# Patient Record
Sex: Female | Born: 1950 | Race: Black or African American | Hispanic: No | Smoking: Never smoker
Health system: Southern US, Community
[De-identification: ages and names within clinical notes are randomized; demographics above are authoritative.]

## PROBLEM LIST (undated history)

## (undated) ENCOUNTER — Emergency Department: Disposition: A | Discharge status: Home / Self Care

## (undated) DIAGNOSIS — N183 Chronic kidney disease, stage 3 unspecified: Secondary | ICD-10-CM

## (undated) DIAGNOSIS — I10 Essential (primary) hypertension: Secondary | ICD-10-CM

## (undated) DIAGNOSIS — B1920 Unspecified viral hepatitis C without hepatic coma: Secondary | ICD-10-CM

## (undated) HISTORY — PX: DILATION AND CURETTAGE OF UTERUS: SHX78

## (undated) HISTORY — DX: Chronic kidney disease, stage 3 unspecified: N18.30

## (undated) HISTORY — PX: UMBILICAL HERNIA REPAIR: SHX196

## (undated) HISTORY — PX: INGUINAL HERNIA REPAIR: SHX194

## (undated) HISTORY — PX: BREAST LUMPECTOMY: SHX2

## (undated) HISTORY — PX: HERNIA REPAIR: SHX51

## (undated) HISTORY — PX: ABDOMINAL HYSTERECTOMY: SHX81

---

## 2008-10-20 ENCOUNTER — Encounter: Admission: RE | Admit: 2008-10-20 | Discharge: 2008-10-20 | Payer: Self-pay | Admitting: Cardiology

## 2009-06-22 ENCOUNTER — Ambulatory Visit (HOSPITAL_COMMUNITY): Admission: RE | Admit: 2009-06-22 | Discharge: 2009-06-22 | Payer: Self-pay | Admitting: Obstetrics and Gynecology

## 2009-06-22 ENCOUNTER — Encounter (INDEPENDENT_AMBULATORY_CARE_PROVIDER_SITE_OTHER): Payer: Self-pay | Admitting: Obstetrics and Gynecology

## 2009-11-27 ENCOUNTER — Emergency Department (HOSPITAL_COMMUNITY): Admission: EM | Admit: 2009-11-27 | Discharge: 2009-11-27 | Payer: Self-pay | Admitting: Family Medicine

## 2010-03-08 ENCOUNTER — Emergency Department (HOSPITAL_COMMUNITY): Admission: EM | Admit: 2010-03-08 | Discharge: 2010-03-08 | Payer: Self-pay | Admitting: Family Medicine

## 2010-07-22 ENCOUNTER — Emergency Department (HOSPITAL_COMMUNITY): Admission: EM | Admit: 2010-07-22 | Discharge: 2010-07-22 | Payer: Self-pay | Admitting: Family Medicine

## 2010-09-25 ENCOUNTER — Other Ambulatory Visit (HOSPITAL_COMMUNITY): Payer: Self-pay | Admitting: Cardiology

## 2010-09-25 DIAGNOSIS — I209 Angina pectoris, unspecified: Secondary | ICD-10-CM

## 2010-10-09 ENCOUNTER — Ambulatory Visit (HOSPITAL_COMMUNITY)
Admission: RE | Admit: 2010-10-09 | Discharge: 2010-10-09 | Disposition: A | Discharge status: Home / Self Care | Source: Ambulatory Visit | Attending: Cardiology | Admitting: Cardiology

## 2010-10-09 ENCOUNTER — Encounter (HOSPITAL_COMMUNITY): Payer: Self-pay

## 2010-10-09 ENCOUNTER — Ambulatory Visit (HOSPITAL_COMMUNITY): Payer: Self-pay

## 2010-10-09 DIAGNOSIS — R9431 Abnormal electrocardiogram [ECG] [EKG]: Secondary | ICD-10-CM | POA: Insufficient documentation

## 2010-10-09 DIAGNOSIS — R079 Chest pain, unspecified: Secondary | ICD-10-CM | POA: Insufficient documentation

## 2010-10-09 DIAGNOSIS — I209 Angina pectoris, unspecified: Secondary | ICD-10-CM

## 2010-10-09 MED ORDER — TECHNETIUM TC 99M TETROFOSMIN IV KIT
10.0000 | PACK | Freq: Once | INTRAVENOUS | Status: AC | PRN
  Administered 2010-10-09: 10 via INTRAVENOUS

## 2010-10-09 MED ORDER — TECHNETIUM TC 99M TETROFOSMIN IV KIT
30.0000 | PACK | Freq: Once | INTRAVENOUS | Status: AC | PRN
  Administered 2010-10-09: 30 via INTRAVENOUS

## 2010-11-12 LAB — POCT URINALYSIS DIPSTICK
Glucose, UA: NEGATIVE mg/dL
Hgb urine dipstick: NEGATIVE
Ketones, ur: NEGATIVE mg/dL
Specific Gravity, Urine: >=1.03 (ref 1.005–1.030)
Urobilinogen, UA: 0.2 mg/dL (ref 0.0–1.0)

## 2010-11-25 LAB — URINE CULTURE: Colony Count: 2000

## 2010-11-25 LAB — POCT URINALYSIS DIP (DEVICE)
Bilirubin Urine: NEGATIVE
Hgb urine dipstick: NEGATIVE
Nitrite: NEGATIVE
Specific Gravity, Urine: 1.015 (ref 1.005–1.030)
pH: 5 (ref 5.0–8.0)

## 2010-12-05 LAB — CBC
MCV: 84.5 fL (ref 78.0–100.0)
Platelets: 282 10*3/uL (ref 150–400)
RBC: 4.32 MIL/uL (ref 3.87–5.11)
WBC: 5.2 10*3/uL (ref 4.0–10.5)

## 2010-12-05 LAB — BASIC METABOLIC PANEL
CO2: 28 mEq/L (ref 19–32)
Chloride: 105 mEq/L (ref 96–112)
GFR calc Af Amer: >60 mL/min (ref >60)
Potassium: 3.3 mEq/L — ABNORMAL LOW (ref 3.5–5.1)
Sodium: 139 mEq/L (ref 135–145)

## 2010-12-21 ENCOUNTER — Inpatient Hospital Stay (INDEPENDENT_AMBULATORY_CARE_PROVIDER_SITE_OTHER)
Admission: RE | Admit: 2010-12-21 | Discharge: 2010-12-21 | Disposition: A | Discharge status: Home / Self Care | Source: Ambulatory Visit | Attending: Emergency Medicine | Admitting: Emergency Medicine

## 2010-12-21 DIAGNOSIS — J019 Acute sinusitis, unspecified: Secondary | ICD-10-CM

## 2010-12-21 DIAGNOSIS — N390 Urinary tract infection, site not specified: Secondary | ICD-10-CM

## 2010-12-21 LAB — POCT URINALYSIS DIP (DEVICE)
Bilirubin Urine: NEGATIVE
Glucose, UA: NEGATIVE mg/dL
Hgb urine dipstick: NEGATIVE
Ketones, ur: NEGATIVE mg/dL
Specific Gravity, Urine: 1.02 (ref 1.005–1.030)
pH: 5.5 (ref 5.0–8.0)

## 2010-12-23 LAB — URINE CULTURE
Colony Count: NO GROWTH
Culture  Setup Time: 201204220139

## 2011-03-21 ENCOUNTER — Inpatient Hospital Stay (INDEPENDENT_AMBULATORY_CARE_PROVIDER_SITE_OTHER)
Admission: RE | Admit: 2011-03-21 | Discharge: 2011-03-21 | Disposition: A | Discharge status: Home / Self Care | Source: Ambulatory Visit | Attending: Family Medicine | Admitting: Family Medicine

## 2011-03-21 ENCOUNTER — Ambulatory Visit (INDEPENDENT_AMBULATORY_CARE_PROVIDER_SITE_OTHER)

## 2011-03-21 DIAGNOSIS — S92919A Unspecified fracture of unspecified toe(s), initial encounter for closed fracture: Secondary | ICD-10-CM

## 2011-05-26 ENCOUNTER — Ambulatory Visit (HOSPITAL_BASED_OUTPATIENT_CLINIC_OR_DEPARTMENT_OTHER)
Admission: RE | Admit: 2011-05-26 | Discharge: 2011-05-26 | Disposition: A | Discharge status: Home / Self Care | Source: Ambulatory Visit | Attending: Obstetrics and Gynecology | Admitting: Obstetrics and Gynecology

## 2011-05-26 ENCOUNTER — Other Ambulatory Visit: Payer: Self-pay | Admitting: Obstetrics and Gynecology

## 2011-05-26 DIAGNOSIS — Z01810 Encounter for preprocedural cardiovascular examination: Secondary | ICD-10-CM | POA: Insufficient documentation

## 2011-05-26 DIAGNOSIS — Z01812 Encounter for preprocedural laboratory examination: Secondary | ICD-10-CM | POA: Insufficient documentation

## 2011-05-26 DIAGNOSIS — N859 Noninflammatory disorder of uterus, unspecified: Secondary | ICD-10-CM | POA: Insufficient documentation

## 2011-05-26 DIAGNOSIS — R9389 Abnormal findings on diagnostic imaging of other specified body structures: Secondary | ICD-10-CM | POA: Insufficient documentation

## 2011-05-26 LAB — POCT I-STAT 4, (NA,K, GLUC, HGB,HCT)
HCT: 40 % (ref 36.0–46.0)
Potassium: 4.2 mEq/L (ref 3.5–5.1)
Sodium: 142 mEq/L (ref 135–145)

## 2011-10-18 ENCOUNTER — Emergency Department (INDEPENDENT_AMBULATORY_CARE_PROVIDER_SITE_OTHER)
Admission: EM | Admit: 2011-10-18 | Discharge: 2011-10-18 | Disposition: A | Discharge status: Home / Self Care | Source: Home / Self Care | Attending: Emergency Medicine | Admitting: Emergency Medicine

## 2011-10-18 ENCOUNTER — Encounter (HOSPITAL_COMMUNITY): Payer: Self-pay | Admitting: *Deleted

## 2011-10-18 DIAGNOSIS — N390 Urinary tract infection, site not specified: Secondary | ICD-10-CM

## 2011-10-18 HISTORY — DX: Essential (primary) hypertension: I10

## 2011-10-18 LAB — POCT URINALYSIS DIP (DEVICE)
Bilirubin Urine: NEGATIVE
Ketones, ur: NEGATIVE mg/dL
Leukocytes, UA: NEGATIVE
Protein, ur: NEGATIVE mg/dL
Specific Gravity, Urine: 1.02 (ref 1.005–1.030)
pH: 5.5 (ref 5.0–8.0)

## 2011-10-18 MED ORDER — CEPHALEXIN 500 MG PO CAPS: 500.0000 mg | ORAL_CAPSULE | Freq: Three times a day (TID) | ORAL | Status: AC

## 2011-10-18 MED ORDER — PHENAZOPYRIDINE HCL 200 MG PO TABS: 200.0000 mg | ORAL_TABLET | Freq: Three times a day (TID) | ORAL | Status: AC | PRN

## 2011-10-18 NOTE — ED Provider Notes (Signed)
Chief Complaint  Patient presents with  . Dysuria  . Back Pain    History of Present Illness:   Mrs. Helminiak has had a five-day history of dysuria, hesitancy, bilateral lower back pain, and lower abdominal pain. She also has some headache, and loose stools. Her last menstrual period was years ago. She denies any GYN complaints. She denies any frequency or urgency. She's had no blood in her urine, fever, chills, nausea, or vomiting. She has had urinary tract infections in the past, her last one was about a year ago. She is allergic to Bactrim.  Review of Systems:  Other than noted above, the patient denies any of the following symptoms: General:  No fevers, chills, sweats, aches, or fatigue. GI:  No abdominal pain, back pain, nausea, vomiting, diarrhea, or constipation. GU:  No dysuria, frequency, urgency, hematuria, or incontinence. GYN:  No discharge, itching, vulvar pain or lesions, pelvic pain, or abnormal vaginal bleeding.  PMFSH:  Past medical history, family history, social history, meds, and allergies were reviewed.  Physical Exam:   Vital signs:  BP 144/78  Pulse 92  Temp(Src) 98.9 F (37.2 C) (Oral)  Resp 18  SpO2 100% Gen:  Alert, oriented, in no distress. Lungs:  Clear to auscultation, no wheezes, rales or rhonchi. Heart:  Regular rhythm, no gallop or murmer. Abdomen:  Flat and soft. There was slight suprapubic pain to palpation.  No guarding, or rebound.  No hepato-splenomegaly or mass.  Bowel sounds were normally active.  No hernia. Back:  No CVA tenderness.  Skin:  Clear, warm and dry.  Labs:   Results for orders placed during the hospital encounter of 10/18/11  POCT URINALYSIS DIP (DEVICE)      Component Value Range   Glucose, UA NEGATIVE  NEGATIVE (mg/dL)   Bilirubin Urine NEGATIVE  NEGATIVE    Ketones, ur NEGATIVE  NEGATIVE (mg/dL)   Specific Gravity, Urine 1.020  1.005 - 1.030    Hgb urine dipstick NEGATIVE  NEGATIVE    pH 5.5  5.0 - 8.0    Protein, ur NEGATIVE   NEGATIVE (mg/dL)   Urobilinogen, UA 0.2  0.0 - 1.0 (mg/dL)   Nitrite NEGATIVE  NEGATIVE    Leukocytes, UA NEGATIVE  NEGATIVE     A urine culture was obtained.  Assessment:  Diagnoses that have been ruled out:  None  Diagnoses that are still under consideration:  None  Final diagnoses:  UTI (lower urinary tract infection)     Plan:   1.  The following meds were prescribed:   New Prescriptions   CEPHALEXIN (KEFLEX) 500 MG CAPSULE    Take 1 capsule (500 mg total) by mouth 3 (three) times daily.   PHENAZOPYRIDINE (PYRIDIUM) 200 MG TABLET    Take 1 tablet (200 mg total) by mouth 3 (three) times daily as needed for pain.   2.  The patient was instructed in symptomatic care and handouts were given. 3.  The patient was told to return if becoming worse in any way, if no better in 3 or 4 days, and given some red flag symptoms that would indicate earlier return. 4.  The patient was told to avoid intercourse for 10 days, get extra fluids, and return for a follow up with her primary care doctor at the completion of treatment for a repeat UA and culture.     Roque Lias, MD 10/18/11 1131

## 2011-10-18 NOTE — ED Notes (Signed)
Pt with c/o burning with urination and low back pain bilaterally x 5 days - history UTI - denies vaginal symptoms

## 2011-10-18 NOTE — Discharge Instructions (Signed)

## 2011-10-20 LAB — URINE CULTURE
Colony Count: >=100000
Culture  Setup Time: 201302162002

## 2011-10-23 NOTE — ED Notes (Signed)
Urine culture: >100,000 colonies Enterococcus species. Pt. treated with Keflex- not on sensitivity. Dr. Lorenz Coaster wrote that it is sensitive to Ampicillin, so tx. should be adequate. Jennifer Trevino 10/23/2011

## 2012-01-17 ENCOUNTER — Other Ambulatory Visit: Payer: Self-pay | Admitting: Cardiology

## 2012-03-25 ENCOUNTER — Other Ambulatory Visit: Payer: Self-pay | Admitting: Cardiology

## 2012-04-25 ENCOUNTER — Other Ambulatory Visit: Payer: Self-pay | Admitting: Cardiology

## 2012-10-31 ENCOUNTER — Encounter (HOSPITAL_COMMUNITY): Payer: Self-pay | Admitting: Emergency Medicine

## 2012-10-31 ENCOUNTER — Emergency Department (INDEPENDENT_AMBULATORY_CARE_PROVIDER_SITE_OTHER)
Admission: EM | Admit: 2012-10-31 | Discharge: 2012-10-31 | Disposition: A | Discharge status: Home / Self Care | Source: Home / Self Care | Attending: Family Medicine | Admitting: Family Medicine

## 2012-10-31 DIAGNOSIS — N390 Urinary tract infection, site not specified: Secondary | ICD-10-CM

## 2012-10-31 LAB — POCT URINALYSIS DIP (DEVICE)
Leukocytes, UA: NEGATIVE
Nitrite: NEGATIVE
Protein, ur: NEGATIVE mg/dL
Urobilinogen, UA: 0.2 mg/dL (ref 0.0–1.0)
pH: 5.5 (ref 5.0–8.0)

## 2012-10-31 MED ORDER — CEPHALEXIN 500 MG PO CAPS: 500.0000 mg | ORAL_CAPSULE | Freq: Four times a day (QID) | ORAL | Status: DC

## 2012-10-31 NOTE — ED Provider Notes (Signed)
History     CSN: [id number]  Arrival date & time 10/31/12  1433   First MD Initiated Contact with Patient 10/31/12 1433      Chief Complaint  Patient presents with  . Urinary Tract Infection    lower back. urgency frequency. nausea    (Consider location/radiation/quality/duration/timing/severity/associated sxs/prior treatment) Patient is a 62 y.o. female presenting with frequency. The history is provided by the patient.  Urinary Frequency This is a new problem. The current episode started more than 2 days ago. The problem has been gradually worsening. Associated symptoms include abdominal pain. Associated symptoms comments: Low back discomfort..    Past Medical History  Diagnosis Date  . Hypertension     Past Surgical History  Procedure Laterality Date  . Breast lumpectomy    . Hernia repair    . Umbilical hernia repair    . Inguinal hernia repair    . Dilation and curettage of uterus      History reviewed. No pertinent family history.  History  Substance Use Topics  . Smoking status: Never Smoker   . Smokeless tobacco: Not on file  . Alcohol Use: No    OB History   Grav Para Term Preterm Abortions TAB SAB Ect Mult Living                  Review of Systems  Constitutional: Negative.  Negative for fever.  Gastrointestinal: Positive for abdominal pain. Negative for nausea and vomiting.  Genitourinary: Positive for dysuria, urgency, frequency and pelvic pain. Negative for vaginal bleeding and vaginal discharge.    Allergies  Bactrim  Home Medications   Current Outpatient Rx  Name  Route  Sig  Dispense  Refill  . amLODipine-valsartan (EXFORGE) 5-320 MG per tablet   Oral   Take 1 tablet by mouth daily.         . Omega-3 Fatty Acids (OMEGA 3 PO)   Oral   Take 2 tablets by mouth 1 day or 1 dose.         . traMADol (ULTRAM) 50 MG tablet   Oral   Take 50 mg by mouth 1 day or 1 dose.         . zolpidem (AMBIEN) 10 MG tablet   Oral   Take 10 mg  by mouth at bedtime as needed.         . cephALEXin (KEFLEX) 500 MG capsule   Oral   Take 1 capsule (500 mg total) by mouth 4 (four) times daily. Take all of medicine and drink lots of fluids   20 capsule   0     BP 122/54  Pulse 113  Temp(Src) 99.6 F (37.6 C) (Oral)  Resp 18  SpO2 96%  Physical Exam  Nursing note and vitals reviewed. Constitutional: She is oriented to person, place, and time. She appears well-developed and well-nourished.  Abdominal: Soft. Bowel sounds are normal. She exhibits no distension and no mass. There is no tenderness. There is no rebound and no guarding.  Neurological: She is alert and oriented to person, place, and time.  Skin: Skin is warm and dry.    ED Course  Procedures (including critical care time)  Labs Reviewed  URINE CULTURE  POCT URINALYSIS DIP (DEVICE)   No results found.   1. UTI (lower urinary tract infection)       MDM          Linna Hoff, MD 10/31/12 1606

## 2012-10-31 NOTE — ED Notes (Signed)
Pt c/o lower back pain. Dysuria. Nausea. Frequency and urgency. Hx of recurrent uti's . Pt has increased fluids and is drinking cranberry juice with mild relief.

## 2012-11-02 LAB — URINE CULTURE

## 2016-10-21 ENCOUNTER — Ambulatory Visit (HOSPITAL_COMMUNITY)
Admission: EM | Admit: 2016-10-21 | Discharge: 2016-10-21 | Disposition: A | Discharge status: Home / Self Care | Payer: Medicare Other | Attending: Family Medicine | Admitting: Family Medicine

## 2016-10-21 ENCOUNTER — Encounter (HOSPITAL_COMMUNITY): Payer: Self-pay | Admitting: Emergency Medicine

## 2016-10-21 DIAGNOSIS — N390 Urinary tract infection, site not specified: Secondary | ICD-10-CM | POA: Insufficient documentation

## 2016-10-21 DIAGNOSIS — R1030 Lower abdominal pain, unspecified: Secondary | ICD-10-CM | POA: Diagnosis present

## 2016-10-21 LAB — POCT URINALYSIS DIP (DEVICE)
Bilirubin Urine: NEGATIVE
GLUCOSE, UA: NEGATIVE mg/dL
Hgb urine dipstick: NEGATIVE
KETONES UR: NEGATIVE mg/dL
Nitrite: POSITIVE — AB
PROTEIN: NEGATIVE mg/dL
SPECIFIC GRAVITY, URINE: 1.01 (ref 1.005–1.030)
Urobilinogen, UA: 0.2 mg/dL (ref 0.0–1.0)
pH: 5.5 (ref 5.0–8.0)

## 2016-10-21 MED ORDER — PHENAZOPYRIDINE HCL 200 MG PO TABS: 200.0000 mg | ORAL_TABLET | Freq: Three times a day (TID) | ORAL | 0 refills | Status: DC | PRN

## 2016-10-21 MED ORDER — CEPHALEXIN 500 MG PO CAPS
500.0000 mg | ORAL_CAPSULE | Freq: Four times a day (QID) | ORAL | 0 refills | Status: DC
Start: 2016-10-21 — End: 2016-11-14

## 2016-10-21 NOTE — ED Provider Notes (Signed)
CSN: [id number]     Arrival date & time 10/21/16  1000 History   First MD Initiated Contact with Patient 10/21/16 1110     Chief Complaint  Patient presents with  . Abdominal Pain   (Consider location/radiation/quality/duration/timing/severity/associated sxs/prior Treatment) 66 year old female patient presents to clinic with 3-4 day history of dysuria, urgency, frequency, as well as lower abdominal pain. States she noticed this morning her urine appeared dark, along with foul odor. She denies fever, chills, nausea, or vomiting. She also denies flank pain, and back pain. She was treated approximately 1 month ago for UTI by her physician in [place], she states her current symptoms are consistent with her previous infection.   The history is provided by the patient.  Abdominal Pain    Past Medical History:  Diagnosis Date  . Hypertension    Past Surgical History:  Procedure Laterality Date  . ABDOMINAL HYSTERECTOMY    . BREAST LUMPECTOMY    . DILATION AND CURETTAGE OF UTERUS    . HERNIA REPAIR    . INGUINAL HERNIA REPAIR    . UMBILICAL HERNIA REPAIR     History reviewed. No pertinent family history. Social History  Substance Use Topics  . Smoking status: Never Smoker  . Smokeless tobacco: Never Used  . Alcohol use No   OB History    No data available     Review of Systems  Reason unable to perform ROS: as covered in HPI.  Gastrointestinal: Positive for abdominal pain.  All other systems reviewed and are negative.   Allergies  Bactrim  Home Medications   Prior to Admission medications   Medication Sig Start Date End Date Taking? Authorizing Provider  amLODipine-valsartan (EXFORGE) 5-320 MG per tablet Take 1 tablet by mouth daily.   Yes Historical Provider, MD  cephALEXin (KEFLEX) 500 MG capsule Take 1 capsule (500 mg total) by mouth 4 (four) times daily. 10/21/16   Barnet Glasgow, NP  phenazopyridine (PYRIDIUM) 200 MG tablet Take 1 tablet (200 mg total) by mouth 3  (three) times daily as needed for pain. 10/21/16   Barnet Glasgow, NP  zolpidem (AMBIEN) 10 MG tablet Take 10 mg by mouth at bedtime as needed.    Historical Provider, MD   Meds Ordered and Administered this Visit  Medications - No data to display  BP 133/81 (BP Location: Right Arm)   Pulse 91   Temp 98.8 F (37.1 C) (Oral)   Resp 18   SpO2 100%  No data found.   Physical Exam  Constitutional: She is oriented to person, place, and time. She appears well-developed and well-nourished. No distress.  Cardiovascular: Normal rate and regular rhythm.   Pulmonary/Chest: Effort normal and breath sounds normal.  Abdominal: Soft. Bowel sounds are normal. She exhibits no distension and no mass. There is no tenderness. There is no guarding and no CVA tenderness.  Neurological: She is alert and oriented to person, place, and time.  Skin: Skin is warm and dry. Capillary refill takes less than 2 seconds. She is not diaphoretic.  Psychiatric: She has a normal mood and affect.  Nursing note and vitals reviewed.   Urgent Care Course     Procedures (including critical care time)  Labs Review Labs Reviewed  POCT URINALYSIS DIP (DEVICE) - Abnormal; Notable for the following:       Result Value   Nitrite POSITIVE (*)    Leukocytes, UA TRACE (*)    All other components within normal limits  URINE CULTURE  Imaging Review No results found.   Visual Acuity Review  Right Eye Distance:   Left Eye Distance:   Bilateral Distance:    Right Eye Near:   Left Eye Near:    Bilateral Near:         MDM   1. Lower urinary tract infectious disease   You are being treated today for a urinary tract infection. I have prescribed Keflex, take 1 tablet 4 times a day for 5 days. I have also prescribed Pyridium. Take 1 tablet a day 3 times a day for 2 days. Your urine will be sent for culture and you will be notified should any change in therapy be needed. Drink plenty of fluids and rest. Should  your symptoms fail to resolve, follow up with your primary care provider or return to clinic.      Barnet Glasgow, NP 10/21/16 1120

## 2016-10-21 NOTE — Discharge Instructions (Signed)
You are being treated today for a urinary tract infection. I have prescribed Keflex, take 1 tablet 4 times a day for 5 days. I have also prescribed Pyridium. Take 1 tablet a day 3 times a day for 2 days. Your urine will be sent for culture and you will be notified should any change in therapy be needed. Drink plenty of fluids and rest. Should your symptoms fail to resolve, follow up with your primary care provider or return to clinic.  °

## 2016-10-21 NOTE — ED Notes (Signed)
Verified phone number 

## 2016-10-21 NOTE — ED Triage Notes (Signed)
The patient presented to the [facility] with a complaint of lower abdominal pain and urinary hesitancy. The patient stated that the urine was cloudy and had a strong odor as well. The patient reported the symptoms for 3 days.

## 2016-10-23 LAB — URINE CULTURE: Culture: >=100000 — AB

## 2016-11-14 ENCOUNTER — Encounter (HOSPITAL_COMMUNITY): Payer: Self-pay | Admitting: Emergency Medicine

## 2016-11-14 ENCOUNTER — Ambulatory Visit (HOSPITAL_COMMUNITY)
Admission: EM | Admit: 2016-11-14 | Discharge: 2016-11-14 | Disposition: A | Discharge status: Home / Self Care | Payer: Medicare Other | Attending: Internal Medicine | Admitting: Internal Medicine

## 2016-11-14 DIAGNOSIS — Z87440 Personal history of urinary (tract) infections: Secondary | ICD-10-CM | POA: Diagnosis not present

## 2016-11-14 DIAGNOSIS — N390 Urinary tract infection, site not specified: Secondary | ICD-10-CM

## 2016-11-14 DIAGNOSIS — R350 Frequency of micturition: Secondary | ICD-10-CM | POA: Diagnosis present

## 2016-11-14 DIAGNOSIS — I10 Essential (primary) hypertension: Secondary | ICD-10-CM | POA: Diagnosis not present

## 2016-11-14 DIAGNOSIS — Z882 Allergy status to sulfonamides status: Secondary | ICD-10-CM | POA: Insufficient documentation

## 2016-11-14 DIAGNOSIS — Z90710 Acquired absence of both cervix and uterus: Secondary | ICD-10-CM | POA: Diagnosis not present

## 2016-11-14 LAB — POCT URINALYSIS DIP (DEVICE)
Bilirubin Urine: NEGATIVE
GLUCOSE, UA: NEGATIVE mg/dL
KETONES UR: NEGATIVE mg/dL
NITRITE: POSITIVE — AB
Protein, ur: NEGATIVE mg/dL
Specific Gravity, Urine: 1.015 (ref 1.005–1.030)
Urobilinogen, UA: 0.2 mg/dL (ref 0.0–1.0)
pH: 6 (ref 5.0–8.0)

## 2016-11-14 MED ORDER — NITROFURANTOIN MONOHYD MACRO 100 MG PO CAPS: 100.0000 mg | ORAL_CAPSULE | Freq: Two times a day (BID) | ORAL | 0 refills | Status: DC

## 2016-11-14 MED ORDER — PHENAZOPYRIDINE HCL 200 MG PO TABS: 200.0000 mg | ORAL_TABLET | Freq: Three times a day (TID) | ORAL | 0 refills | Status: DC

## 2016-11-14 NOTE — ED Provider Notes (Signed)
[place]    CSN: [id number] Arrival date & time: 11/14/16  1027     History   Chief Complaint Chief Complaint  Patient presents with  . Urinary Frequency  . Abdominal Pain    HPI Jennifer Trevino is a 66 y.o. female. She presents today with 3-4 day history of urinary discomfort, frequency, mild suprapubic discomfort. Appetite is okay. Not vomiting, not febrile. She had a hysterectomy in [place] a few months ago, and has had several UTIs since.    HPI  Past Medical History:  Diagnosis Date  . Hypertension     Past Surgical History:  Procedure Laterality Date  . ABDOMINAL HYSTERECTOMY    . BREAST LUMPECTOMY    . DILATION AND CURETTAGE OF UTERUS    . HERNIA REPAIR    . INGUINAL HERNIA REPAIR    . UMBILICAL HERNIA REPAIR       Home Medications    Prior to Admission medications   Medication Sig Start Date End Date Taking? Authorizing Provider  amLODipine-valsartan (EXFORGE) 5-320 MG per tablet Take 1 tablet by mouth daily.   Yes Historical Provider, MD  zolpidem (AMBIEN) 10 MG tablet Take 10 mg by mouth at bedtime as needed.   Yes Historical Provider, MD  nitrofurantoin, macrocrystal-monohydrate, (MACROBID) 100 MG capsule Take 1 capsule (100 mg total) by mouth 2 (two) times daily. 11/14/16   Sherlene Shams, MD  phenazopyridine (PYRIDIUM) 200 MG tablet Take 1 tablet (200 mg total) by mouth 3 (three) times daily. 11/14/16   Sherlene Shams, MD    Family History History reviewed. No pertinent family history.  Social History Social History  Substance Use Topics  . Smoking status: Never Smoker  . Smokeless tobacco: Never Used  . Alcohol use No     Allergies   Bactrim   Review of Systems Review of Systems  All other systems reviewed and are negative.    Physical Exam Triage Vital Signs ED Triage Vitals  Enc Vitals Group     BP 11/14/16 1116 130/79     Pulse Rate 11/14/16 1116 98     Resp 11/14/16 1116 18     Temp 11/14/16 1116 98.7 F (37.1  C)     Temp Source 11/14/16 1116 Oral     SpO2 11/14/16 1116 99 %     Weight --      Height --      Pain Score 11/14/16 1115 6     Pain Loc --    Updated Vital Signs BP 130/79 (BP Location: Right Arm)   Pulse 98   Temp 98.7 F (37.1 C) (Oral)   Resp 18   SpO2 99%   Physical Exam  Constitutional: She is oriented to person, place, and time. No distress.  Alert, nicely groomed  HENT:  Head: Atraumatic.  Eyes:  Conjugate gaze, no eye redness/drainage  Neck: Neck supple.  Cardiovascular: Normal rate.   Pulmonary/Chest: No respiratory distress.  Abdominal: She exhibits no distension.  Musculoskeletal: Normal range of motion.  No leg swelling  Neurological: She is alert and oriented to person, place, and time.  Skin: Skin is warm and dry.  No cyanosis  Nursing note and vitals reviewed.    UC Treatments / Results  Labs Results for orders placed or performed during the hospital encounter of 11/14/16  POCT urinalysis dip (device)  Result Value Ref Range   Glucose, UA NEGATIVE NEGATIVE mg/dL   Bilirubin Urine NEGATIVE NEGATIVE   Ketones, ur  NEGATIVE NEGATIVE mg/dL   Specific Gravity, Urine 1.015 1.005 - 1.030   Hgb urine dipstick TRACE (A) NEGATIVE   pH 6.0 5.0 - 8.0   Protein, ur NEGATIVE NEGATIVE mg/dL   Urobilinogen, UA 0.2 0.0 - 1.0 mg/dL   Nitrite POSITIVE (A) NEGATIVE   Leukocytes, UA SMALL (A) NEGATIVE    Procedures Procedures (including critical care time) None today  Final Clinical Impressions(s) / UC Diagnoses   Final diagnoses:  Acute UTI   Push fluids, rest.  Recheck for new fever >100.5, persistent urinary symptoms, or if not starting to improve in a few days.  Urine test was consistent with UTI today; a urine culture is pending.  Prescriptions for phenazopyridine (for urinary discomfort) and nitrofurantoin (antibiotic) were sent to the [facility] on [address].  Followup with urologist to discuss further evaluation/management for frequent UTIs (contact  information in this handout).    New Prescriptions New Prescriptions   NITROFURANTOIN, MACROCRYSTAL-MONOHYDRATE, (MACROBID) 100 MG CAPSULE    Take 1 capsule (100 mg total) by mouth 2 (two) times daily.   PHENAZOPYRIDINE (PYRIDIUM) 200 MG TABLET    Take 1 tablet (200 mg total) by mouth 3 (three) times daily.     Sherlene Shams, MD 11/14/16 2209

## 2016-11-14 NOTE — ED Triage Notes (Signed)
The patient presented to the [facility] with a complaint of recurrent urinary frequency/urgency, dysuria and lower abdominal pain x 3 days.

## 2016-11-14 NOTE — Discharge Instructions (Addendum)
Push fluids, rest.  Recheck for new fever >100.5, persistent urinary symptoms, or if not starting to improve in a few days.  Urine test was consistent with UTI today; a urine culture is pending.  Prescriptions for phenazopyridine (for urinary discomfort) and nitrofurantoin (antibiotic) were sent to the [facility] on [address].  Followup with urologist to discuss further evaluation/management for frequent UTIs (contact information in this handout).

## 2016-11-16 LAB — URINE CULTURE

## 2017-12-17 ENCOUNTER — Other Ambulatory Visit: Payer: Self-pay

## 2017-12-17 ENCOUNTER — Encounter (HOSPITAL_COMMUNITY): Payer: Self-pay | Admitting: Emergency Medicine

## 2017-12-17 ENCOUNTER — Ambulatory Visit (HOSPITAL_COMMUNITY)
Admission: EM | Admit: 2017-12-17 | Discharge: 2017-12-17 | Disposition: A | Discharge status: Home / Self Care | Payer: Medicare Other | Attending: Family Medicine | Admitting: Family Medicine

## 2017-12-17 DIAGNOSIS — Z791 Long term (current) use of non-steroidal anti-inflammatories (NSAID): Secondary | ICD-10-CM | POA: Insufficient documentation

## 2017-12-17 DIAGNOSIS — Z9889 Other specified postprocedural states: Secondary | ICD-10-CM | POA: Diagnosis not present

## 2017-12-17 DIAGNOSIS — Z90710 Acquired absence of both cervix and uterus: Secondary | ICD-10-CM | POA: Insufficient documentation

## 2017-12-17 DIAGNOSIS — Z113 Encounter for screening for infections with a predominantly sexual mode of transmission: Secondary | ICD-10-CM | POA: Diagnosis not present

## 2017-12-17 DIAGNOSIS — R3915 Urgency of urination: Secondary | ICD-10-CM | POA: Diagnosis not present

## 2017-12-17 DIAGNOSIS — Z79899 Other long term (current) drug therapy: Secondary | ICD-10-CM | POA: Diagnosis not present

## 2017-12-17 DIAGNOSIS — R102 Pelvic and perineal pain: Secondary | ICD-10-CM

## 2017-12-17 DIAGNOSIS — R300 Dysuria: Secondary | ICD-10-CM | POA: Diagnosis not present

## 2017-12-17 DIAGNOSIS — N898 Other specified noninflammatory disorders of vagina: Secondary | ICD-10-CM

## 2017-12-17 DIAGNOSIS — Z202 Contact with and (suspected) exposure to infections with a predominantly sexual mode of transmission: Secondary | ICD-10-CM

## 2017-12-17 DIAGNOSIS — R1032 Left lower quadrant pain: Secondary | ICD-10-CM

## 2017-12-17 DIAGNOSIS — R1030 Lower abdominal pain, unspecified: Secondary | ICD-10-CM

## 2017-12-17 DIAGNOSIS — I10 Essential (primary) hypertension: Secondary | ICD-10-CM | POA: Diagnosis not present

## 2017-12-17 DIAGNOSIS — Z882 Allergy status to sulfonamides status: Secondary | ICD-10-CM | POA: Insufficient documentation

## 2017-12-17 LAB — POCT URINALYSIS DIP (DEVICE)
Bilirubin Urine: NEGATIVE
GLUCOSE, UA: NEGATIVE mg/dL
Hgb urine dipstick: NEGATIVE
Ketones, ur: NEGATIVE mg/dL
Leukocytes, UA: NEGATIVE
Nitrite: NEGATIVE
PROTEIN: NEGATIVE mg/dL
Specific Gravity, Urine: 1.02 (ref 1.005–1.030)
UROBILINOGEN UA: 0.2 mg/dL (ref 0.0–1.0)
pH: 5.5 (ref 5.0–8.0)

## 2017-12-17 MED ORDER — DOCUSATE SODIUM 50 MG PO CAPS: 50.0000 mg | ORAL_CAPSULE | Freq: Two times a day (BID) | ORAL | 0 refills | Status: DC

## 2017-12-17 MED ORDER — CEFTRIAXONE SODIUM 250 MG IJ SOLR
250.0000 mg | Freq: Once | INTRAMUSCULAR | Status: AC
  Administered 2017-12-17: 250 mg via INTRAMUSCULAR

## 2017-12-17 MED ORDER — POLYETHYLENE GLYCOL 3350 17 G PO PACK
17.0000 g | PACK | Freq: Every day | ORAL | 0 refills | Status: DC
Start: 2017-12-17 — End: 2019-07-26

## 2017-12-17 MED ORDER — AZITHROMYCIN 250 MG PO TABS
ORAL_TABLET | ORAL | Status: AC
  Filled 2017-12-17: qty 4

## 2017-12-17 MED ORDER — MELOXICAM 7.5 MG PO TABS: 7.5000 mg | ORAL_TABLET | Freq: Every day | ORAL | 0 refills | Status: DC | PRN

## 2017-12-17 MED ORDER — AZITHROMYCIN 250 MG PO TABS
1000.0000 mg | ORAL_TABLET | Freq: Once | ORAL | Status: AC
  Administered 2017-12-17: 1000 mg via ORAL

## 2017-12-17 MED ORDER — LIDOCAINE HCL (PF) 1 % IJ SOLN
INTRAMUSCULAR | Status: AC
Start: 2017-12-17 — End: ?
  Filled 2017-12-17: qty 2

## 2017-12-17 MED ORDER — CEFTRIAXONE SODIUM 250 MG IJ SOLR
INTRAMUSCULAR | Status: AC
  Filled 2017-12-17: qty 250

## 2017-12-17 NOTE — ED Triage Notes (Signed)
C/o lower abdominal pain onset 3 days, states may have been exposed to gonorrhea, denies dysuria or any vaginal changes

## 2017-12-17 NOTE — Discharge Instructions (Signed)
You were treated empirically for gonorrhea and chlamydia. Azithromycin 1g by mouth and Rocephin 250mg  injection given in office today. Cytology sent, you will be contacted with any positive results that requires further treatment. Refrain from sexual activity and alcohol use for the next 7 days.    As discussed, no alarming signs on exam. We will try to move your bowels to see if it will help with the abdominal pain. Colace is a strong medicine, may cause some abdominal cramping, but will make your bowels go faster. Miralax with fluids as directed for the next 3-5 days. Monitor for any worsening of symptoms, fever, worsening abdominal pain, nausea, vomiting, unable to jump up and down due to the pain, go to the emergency department for further evaluation needed.

## 2017-12-17 NOTE — ED Provider Notes (Signed)
[name]    CSN: [id number] Arrival date & time: 12/17/17  1140     History   Chief Complaint Chief Complaint  Patient presents with  . Abdominal Pain    HPI Jennifer Trevino is a 67 y.o. female.   67 year old female comes in for 3-day history of low abdominal pain.  Abdominal pain is constant,  describes it as "bad toothache".  Denies any aggravating or alleviating factor.  Denies worsening symptoms with eating.  Denies fever, chills, night sweats.  Denies nausea, vomiting.  Last bowel movement last night, with some straining, has had a bowel movement every day and regular.  Intermittent dysuria and urinary urgency without hematuria.  Has noticed mild vaginal discharge without itching or pain.  Sexually active with one female partner without condom use.  Was recently told by partner that he was positive for gonorrhea.  She had a hysterectomy last year November 2018, states ovaries removed as well.  Had a colonoscopy 8 months ago in [place], states had 2 polyps removed, states there was one that was "inoperable", denies diverticulum.      Past Medical History:  Diagnosis Date  . Hypertension     There are no active problems to display for this patient.   Past Surgical History:  Procedure Laterality Date  . ABDOMINAL HYSTERECTOMY    . BREAST LUMPECTOMY    . DILATION AND CURETTAGE OF UTERUS    . HERNIA REPAIR    . INGUINAL HERNIA REPAIR    . UMBILICAL HERNIA REPAIR      OB History   None      Home Medications    Prior to Admission medications   Medication Sig Start Date End Date Taking? Authorizing Provider  amLODipine-valsartan (EXFORGE) 5-320 MG per tablet Take 1 tablet by mouth daily.    [provider]  docusate sodium (COLACE) 50 MG capsule Take 1 capsule (50 mg total) by mouth 2 (two) times daily. 12/17/17   Tasia Catchings, Amy V, PA-C  meloxicam (MOBIC) 7.5 MG tablet Take 1 tablet (7.5 mg total) by mouth daily as needed for pain. 12/17/17   Tasia Catchings, Amy V,  PA-C  polyethylene glycol (MIRALAX) packet Take 17 g by mouth daily. 12/17/17   Tasia Catchings, Amy V, PA-C  zolpidem (AMBIEN) 10 MG tablet Take 10 mg by mouth at bedtime as needed.    [provider]    Family History No family history on file.  Social History Social History   Tobacco Use  . Smoking status: Never Smoker  . Smokeless tobacco: Never Used  Substance Use Topics  . Alcohol use: No  . Drug use: No     Allergies   Bactrim   Review of Systems Review of Systems   Physical Exam Triage Vital Signs ED Triage Vitals  Enc Vitals Group     BP 12/17/17 1220 134/88     Pulse Rate 12/17/17 1220 (!) 107     Resp --      Temp 12/17/17 1220 98.5 F (36.9 C)     Temp Source 12/17/17 1220 Oral     SpO2 12/17/17 1220 100 %     Weight --      Height --      Head Circumference --      Peak Flow --      Pain Score 12/17/17 1218 10     Pain Loc --      Pain Edu? --      Excl. in  GC? --    No data found.  Updated Vital Signs BP 134/88 (BP Location: Right Arm)   Pulse (!) 107   Temp 98.5 F (36.9 C) (Oral)   SpO2 100%   Physical Exam  Constitutional: She is oriented to person, place, and time. She appears well-developed and well-nourished. No distress.  HENT:  Head: Normocephalic and atraumatic.  Eyes: Pupils are equal, round, and reactive to light. Conjunctivae are normal.  Cardiovascular: Normal rate, regular rhythm and normal heart sounds. Exam reveals no gallop and no friction rub.  No murmur heard. Pulmonary/Chest: Effort normal and breath sounds normal. She has no wheezes. She has no rales.  Abdominal: Soft. Bowel sounds are normal. She exhibits no mass. There is no rigidity, no rebound, no guarding and no CVA tenderness.  LLQ and suprapubic pain on palpation without rebound.   Musculoskeletal:  No tenderness on palpation of hips. Full ROM of hips. Strength normal and equal bilaterally. Negative straight leg raise.   Neurological: She is alert and oriented  to person, place, and time.  Skin: Skin is warm and dry.  Psychiatric: She has a normal mood and affect. Her behavior is normal. Judgment normal.     UC Treatments / Results  Labs (all labs ordered are listed, but only abnormal results are displayed) Labs Reviewed  POCT URINALYSIS DIP (DEVICE)  URINE CYTOLOGY ANCILLARY ONLY    EKG None Radiology No results found.  Procedures Procedures (including critical care time)  Medications Ordered in UC Medications  azithromycin (ZITHROMAX) tablet 1,000 mg (has no administration in time range)  cefTRIAXone (ROCEPHIN) injection 250 mg (has no administration in time range)     Initial Impression / Assessment and Plan / UC Course  I have reviewed the triage vital signs and the nursing notes.  Pertinent labs & imaging results that were available during my care of the patient were reviewed by me and considered in my medical decision making (see chart for details).    Discussed case with Dr Sabra Heck. No alarming signs on exam. Will treat for GC with azithromycin and rocephin. Cytology sent. Given no history of diverticulitis and already receiving abx. Will have patient try colace and miralax given recent straining of stools. Push fluids. Return precautions given. Patient expresses understanding and agrees to plan.  Patient requesting medicine to help with pain. Will call in some Mobic.   Final Clinical Impressions(s) / UC Diagnoses   Final diagnoses:  Left lower quadrant pain  STD exposure  Suprapubic pain    ED Discharge Orders        Ordered    docusate sodium (COLACE) 50 MG capsule  2 times daily     12/17/17 1305    polyethylene glycol (MIRALAX) packet  Daily     12/17/17 1305    meloxicam (MOBIC) 7.5 MG tablet  Daily PRN     12/17/17 1305        Ok Edwards, PA-C 12/17/17 1320

## 2017-12-18 LAB — URINE CYTOLOGY ANCILLARY ONLY
Chlamydia: NEGATIVE
NEISSERIA GONORRHEA: NEGATIVE
TRICH (WINDOWPATH): NEGATIVE

## 2017-12-22 LAB — URINE CYTOLOGY ANCILLARY ONLY
Bacterial vaginitis: NEGATIVE
Candida vaginitis: NEGATIVE

## 2017-12-23 ENCOUNTER — Telehealth (HOSPITAL_COMMUNITY): Payer: Self-pay

## 2017-12-23 NOTE — Telephone Encounter (Signed)
Results are within normal range. No answer at this time.

## 2018-03-16 ENCOUNTER — Other Ambulatory Visit: Payer: Self-pay

## 2018-03-16 ENCOUNTER — Encounter (HOSPITAL_COMMUNITY): Payer: Self-pay | Admitting: Emergency Medicine

## 2018-03-16 ENCOUNTER — Ambulatory Visit (HOSPITAL_COMMUNITY)
Admission: EM | Admit: 2018-03-16 | Discharge: 2018-03-16 | Disposition: A | Discharge status: Home / Self Care | Payer: Medicare Other | Attending: Family Medicine | Admitting: Family Medicine

## 2018-03-16 DIAGNOSIS — I10 Essential (primary) hypertension: Secondary | ICD-10-CM | POA: Diagnosis not present

## 2018-03-16 DIAGNOSIS — N3090 Cystitis, unspecified without hematuria: Secondary | ICD-10-CM | POA: Diagnosis present

## 2018-03-16 LAB — POCT URINALYSIS DIP (DEVICE)
BILIRUBIN URINE: NEGATIVE
Glucose, UA: NEGATIVE mg/dL
HGB URINE DIPSTICK: NEGATIVE
Ketones, ur: NEGATIVE mg/dL
Nitrite: POSITIVE — AB
Protein, ur: NEGATIVE mg/dL
Specific Gravity, Urine: 1.02 (ref 1.005–1.030)
Urobilinogen, UA: 0.2 mg/dL (ref 0.0–1.0)
pH: 5.5 (ref 5.0–8.0)

## 2018-03-16 MED ORDER — PHENAZOPYRIDINE HCL 200 MG PO TABS: 200.0000 mg | ORAL_TABLET | Freq: Three times a day (TID) | ORAL | 0 refills | Status: DC

## 2018-03-16 MED ORDER — CEPHALEXIN 500 MG PO CAPS: 500.0000 mg | ORAL_CAPSULE | Freq: Two times a day (BID) | ORAL | 0 refills | Status: DC

## 2018-03-16 NOTE — ED Provider Notes (Signed)
[place]    ASSESSMENT & PLAN:  1. Cystitis     Meds ordered this encounter  Medications  . cephALEXin (KEFLEX) 500 MG capsule    Sig: Take 1 capsule (500 mg total) by mouth 2 (two) times daily.    Dispense:  10 capsule    Refill:  0  . phenazopyridine (PYRIDIUM) 200 MG tablet    Sig: Take 1 tablet (200 mg total) by mouth 3 (three) times daily.    Dispense:  6 tablet    Refill:  0    Urine culture sent. Will notify patient when results available. Will follow up with her PCP or here if not showing improvement over the next 48 hours, sooner if needed.  Outlined signs and symptoms indicating need for more acute intervention. Patient verbalized understanding. After Visit Summary given.  SUBJECTIVE:  Jennifer Trevino is a 67 y.o. female who complains of urinary frequency, urgency and dysuria for the past few days. No flank pain, fever, chills, abnormal vaginal discharge or bleeding. Hematuria: not present. Normal PO intake. No abdominal pain. No self treatment. Ambulatory without difficulty. No specific aggravating or alleviating factors reported.  LMP: No LMP recorded. Patient has had a hysterectomy.  ROS: As in HPI.  OBJECTIVE:  Vitals:   03/16/18 0939  BP: (!) 147/94  Pulse: (!) 103  Resp: 16  Temp: 98.3 F (36.8 C)  TempSrc: Oral  SpO2: 95%   Appears well, in no apparent distress. Abdomen is soft without tenderness, guarding, mass, rebound or organomegaly. No CVA tenderness or inguinal adenopathy noted.  Labs Reviewed  POCT URINALYSIS DIP (DEVICE) - Abnormal; Notable for the following components:      Result Value   Nitrite POSITIVE (*)    Leukocytes, UA TRACE (*)    All other components within normal limits  URINE CULTURE    Allergies  Allergen Reactions  . Bactrim     Past Medical History:  Diagnosis Date  . Hypertension    Social History   Socioeconomic History  . Marital status: Widowed    Spouse name: Not on file  . Number of  children: Not on file  . Years of education: Not on file  . Highest education level: Not on file  Occupational History  . Not on file  Social Needs  . Financial resource strain: Not on file  . Food insecurity:    Worry: Not on file    Inability: Not on file  . Transportation needs:    Medical: Not on file    Non-medical: Not on file  Tobacco Use  . Smoking status: Never Smoker  . Smokeless tobacco: Never Used  Substance and Sexual Activity  . Alcohol use: No  . Drug use: No  . Sexual activity: Yes    Birth control/protection: Condom  Lifestyle  . Physical activity:    Days per week: Not on file    Minutes per session: Not on file  . Stress: Not on file  Relationships  . Social connections:    Talks on phone: Not on file    Gets together: Not on file    Attends religious service: Not on file    Active member of club or organization: Not on file    Attends meetings of clubs or organizations: Not on file    Relationship status: Not on file  . Intimate partner violence:    Fear of current or ex partner: Not on file    Emotionally abused: Not on  file    Physically abused: Not on file    Forced sexual activity: Not on file  Other Topics Concern  . Not on file  Social History Narrative  . Not on file   History reviewed. No pertinent family history.     Vanessa Kick, MD 03/16/18 1054

## 2018-03-16 NOTE — ED Triage Notes (Signed)
Pt reports recurrent bladder infections since she had a hysterectomy last November.  Pt has been seen by a Dealer in [place].  She complains of cloudy urine, pain with urination and lower back pain for the last two days.

## 2018-03-18 LAB — URINE CULTURE: Culture: >=100000 — AB

## 2019-05-17 ENCOUNTER — Encounter (HOSPITAL_COMMUNITY): Payer: Self-pay | Admitting: Emergency Medicine

## 2019-05-17 ENCOUNTER — Other Ambulatory Visit: Payer: Self-pay

## 2019-05-17 ENCOUNTER — Ambulatory Visit (HOSPITAL_COMMUNITY)
Admission: EM | Admit: 2019-05-17 | Discharge: 2019-05-17 | Disposition: A | Discharge status: Home / Self Care | Payer: Medicare Other | Attending: Emergency Medicine | Admitting: Emergency Medicine

## 2019-05-17 DIAGNOSIS — R112 Nausea with vomiting, unspecified: Secondary | ICD-10-CM | POA: Diagnosis not present

## 2019-05-17 DIAGNOSIS — R197 Diarrhea, unspecified: Secondary | ICD-10-CM | POA: Diagnosis not present

## 2019-05-17 MED ORDER — DIPHENOXYLATE-ATROPINE 2.5-0.025 MG PO TABS: 1.0000 | ORAL_TABLET | Freq: Four times a day (QID) | ORAL | 0 refills | Status: DC | PRN

## 2019-05-17 MED ORDER — ONDANSETRON 8 MG PO TBDP: ORAL_TABLET | ORAL | 0 refills | Status: DC

## 2019-05-17 NOTE — ED Notes (Signed)
Provided patient with supplies for collecting stool at home.  Patient states understanding of instructions

## 2019-05-17 NOTE — ED Provider Notes (Signed)
HPI  SUBJECTIVE:  Jennifer Trevino is a 68 y.o. female who presents with multiple episodes of nonbilious, nonbloody emesis, watery, nonbloody diarrhea for the past week after eating some "bad beef".  She also states that she finished Cipro for UTI that day.  She states that the nausea and vomiting is getting better, but she continues to have it.  She is tolerating fluids now.  She denies fevers.  States that her abdomen is sore from the vomiting.  No other abdominal pain.  Denies abdominal distention.  Slightly decreased urine output, depending on how much fluid that she can take in.  She has tried loperamide for her symptoms, with some improvement in the diarrhea.  Symptoms worse with trying to eat.  States that "it goes right through me".  No raw or undercooked foods, recent travel, camping, sick contacts.  She denies COVID symptoms including cough, shortness of breath, nasal congestion, sore throat, loss of sense of smell or taste, fevers.  She has a past medical history of hypertension, hepatitis C, she is status post an umbilical and inguinal hernia repair.  No history of diabetes, ulcerative colitis, Crohn's, chronic kidney disease, C. difficile diarrhea, atrial fibrillation, mesenteric ischemia, gallbladder disease, pancreatitis.  [id number], Prudencio Burly, MD     Past Medical History:  Diagnosis Date  . Hypertension     Past Surgical History:  Procedure Laterality Date  . ABDOMINAL HYSTERECTOMY    . BREAST LUMPECTOMY    . DILATION AND CURETTAGE OF UTERUS    . HERNIA REPAIR    . INGUINAL HERNIA REPAIR    . UMBILICAL HERNIA REPAIR      History reviewed. No pertinent family history.  Social History   Tobacco Use  . Smoking status: Never Smoker  . Smokeless tobacco: Never Used  Substance Use Topics  . Alcohol use: No  . Drug use: No    No current facility-administered medications for this encounter.   Current Outpatient Medications:  .  amLODipine-valsartan (EXFORGE) 5-320 MG per  tablet, Take 1 tablet by mouth daily., Disp: , Rfl:  .  diphenoxylate-atropine (LOMOTIL) 2.5-0.025 MG tablet, Take 1 tablet by mouth 4 (four) times daily as needed for diarrhea or loose stools., Disp: 30 tablet, Rfl: 0 .  meloxicam (MOBIC) 7.5 MG tablet, Take 1 tablet (7.5 mg total) by mouth daily as needed for pain., Disp: 5 tablet, Rfl: 0 .  ondansetron (ZOFRAN ODT) 8 MG disintegrating tablet, 1/2- 1 tablet q 8 hr prn nausea, vomiting, Disp: 20 tablet, Rfl: 0 .  phenazopyridine (PYRIDIUM) 200 MG tablet, Take 1 tablet (200 mg total) by mouth 3 (three) times daily., Disp: 6 tablet, Rfl: 0 .  polyethylene glycol (MIRALAX) packet, Take 17 g by mouth daily., Disp: 14 each, Rfl: 0 .  zolpidem (AMBIEN) 10 MG tablet, Take 10 mg by mouth at bedtime as needed., Disp: , Rfl:   Allergies  Allergen Reactions  . Bactrim      ROS  As noted in HPI.   Physical Exam  BP 133/70 (BP Location: Left Arm)   Pulse 95   Temp 98.1 F (36.7 C) (Tympanic)   Resp 17   SpO2 99%   Constitutional: Well developed, well nourished, no acute distress Eyes:  EOMI, conjunctiva normal bilaterally HENT: Normocephalic, atraumatic,mucus membranes moist Respiratory: Normal inspiratory effort Cardiovascular: Normal rate regular rhythm no m/r/g. Cap refill < 2 sec GI: nondistended soft, mild diffuse tenderness, neg murphy, neg mcburney, active bowel sounds, no masses.  No guarding, rebound. skin: No  rash, skin intact Musculoskeletal: no deformities Neurologic: Alert & oriented x 3, no focal neuro deficits Psychiatric: Speech and behavior appropriate   ED Course   Medications - No data to display  Orders Placed This Encounter  Procedures  . Gastrointestinal Panel by PCR , Stool    Standing Status:   Standing    Number of Occurrences:   1  . C Difficile Quick Screen w PCR reflex    Standing Status:   Standing    Number of Occurrences:   1  . Gastrointestinal Pathogen Panel PCR  . Enteric precautions (UV  disinfection) C difficile, Norovirus    Standing Status:   Standing    Number of Occurrences:   1    No results found for this or any previous visit (from the past 24 hour(s)). No results found.  ED Clinical Impression  1. Nausea vomiting and diarrhea      ED Assessment/Plan  Patient with a gastroenteritis most likely from the questionable beef that she ate 5 days ago.  Doubt COVID.  Abdomen benign.  Suspect her diffuse tenderness is from the vomiting.  Her vitals are normal, afebrile.  She has no history of kidney disease.  She declined Zofran here.  We will give her something to drink.  Ordering C. difficile given recent antibiotic use and GI pathogen panel.  Plan to send home with Zofran, push electrolyte containing fluids, Lomotil. Withholding abx pending stool cx results.   Patient tried but was unable to provide a stool sample here.  States that she would like to try at home.  Discussed labs, MDM, treatment plan, and plan for follow-up with patient. Discussed sn/sx that should prompt return to the ED. patient agrees with plan.   Meds ordered this encounter  Medications  . diphenoxylate-atropine (LOMOTIL) 2.5-0.025 MG tablet    Sig: Take 1 tablet by mouth 4 (four) times daily as needed for diarrhea or loose stools.    Dispense:  30 tablet    Refill:  0  . ondansetron (ZOFRAN ODT) 8 MG disintegrating tablet    Sig: 1/2- 1 tablet q 8 hr prn nausea, vomiting    Dispense:  20 tablet    Refill:  0    *This clinic note was created using [employer]. Therefore, there may be occasional mistakes despite careful proofreading.   ?    Melynda Ripple, MD 05/17/19 1104

## 2019-05-17 NOTE — Discharge Instructions (Signed)
Stop loperamide.  Try Lomotil instead.  Zofran will help with the nausea, vomiting and will also slow down the diarrhea.  Push electrolyte containing fluids such as Gatorade or Pedialyte.  Please bring your stool sample back here soon as you possibly can.

## 2019-05-17 NOTE — ED Triage Notes (Signed)
Pt sts N/V/D x 5 days since eating "bad beef"

## 2019-05-18 ENCOUNTER — Telehealth (HOSPITAL_COMMUNITY): Payer: Self-pay | Admitting: Emergency Medicine

## 2019-05-18 NOTE — Telephone Encounter (Signed)
PHONE ORDER CREATION FOR GI PCR

## 2019-05-19 LAB — GASTROINTESTINAL PANEL BY PCR, STOOL (REPLACES STOOL CULTURE)

## 2019-07-26 ENCOUNTER — Other Ambulatory Visit: Payer: Self-pay

## 2019-07-26 ENCOUNTER — Ambulatory Visit (HOSPITAL_COMMUNITY)
Admission: EM | Admit: 2019-07-26 | Discharge: 2019-07-26 | Disposition: A | Discharge status: Home / Self Care | Payer: Medicare Other | Attending: Family Medicine | Admitting: Family Medicine

## 2019-07-26 ENCOUNTER — Ambulatory Visit (INDEPENDENT_AMBULATORY_CARE_PROVIDER_SITE_OTHER): Payer: Medicare Other

## 2019-07-26 ENCOUNTER — Encounter (HOSPITAL_COMMUNITY): Payer: Self-pay

## 2019-07-26 DIAGNOSIS — S46911A Strain of unspecified muscle, fascia and tendon at shoulder and upper arm level, right arm, initial encounter: Secondary | ICD-10-CM

## 2019-07-26 MED ORDER — HYDROCODONE-ACETAMINOPHEN 5-325 MG PO TABS: 1.0000 | ORAL_TABLET | Freq: Four times a day (QID) | ORAL | 0 refills | Status: DC | PRN

## 2019-07-26 MED ORDER — DICLOFENAC SODIUM 75 MG PO TBEC: 75.0000 mg | DELAYED_RELEASE_TABLET | Freq: Two times a day (BID) | ORAL | 0 refills | Status: DC

## 2019-07-26 NOTE — ED Triage Notes (Signed)
Patient presents to Urgent Care with complaints of right shoulder and right hip pain since falling down 4 stairs yesterday. Patient reports her shoe broke as she was walking down the steps and she caught herself on her shoulder and right hip.

## 2019-07-26 NOTE — Discharge Instructions (Addendum)
Gently rotate your right arm several times a day to keep flexibility

## 2019-07-26 NOTE — ED Provider Notes (Addendum)
[place]    CSN: [id number] Arrival date & time: 07/26/19  [id number]      History   Chief Complaint Chief Complaint  Patient presents with  . Fall    HPI Jennifer Trevino is a 68 y.o. female.   [facility] urgent care patient who presents to Urgent Care with complaints of right shoulder and right hip pain since falling down 4 stairs yesterday. Patient reports her shoe broke as she was walking down the steps and she caught herself on her shoulder and right hip.   Pain is worse today although she can move the right arm some without excruciating pain.      Past Medical History:  Diagnosis Date  . Hypertension     There are no active problems to display for this patient.   Past Surgical History:  Procedure Laterality Date  . ABDOMINAL HYSTERECTOMY    . BREAST LUMPECTOMY    . DILATION AND CURETTAGE OF UTERUS    . HERNIA REPAIR    . INGUINAL HERNIA REPAIR    . UMBILICAL HERNIA REPAIR      OB History   No obstetric history on file.      Home Medications    Prior to Admission medications   Medication Sig Start Date End Date Taking? Authorizing Provider  amLODipine-valsartan (EXFORGE) 5-320 MG per tablet Take 1 tablet by mouth daily.    [provider]  diclofenac (VOLTAREN) 75 MG EC tablet Take 1 tablet (75 mg total) by mouth 2 (two) times daily. 07/26/19   Robyn Haber, MD  diphenoxylate-atropine (LOMOTIL) 2.5-0.025 MG tablet Take 1 tablet by mouth 4 (four) times daily as needed for diarrhea or loose stools. 05/17/19   Melynda Ripple, MD  HYDROcodone-acetaminophen (NORCO) 5-325 MG tablet Take 1 tablet by mouth every 6 (six) hours as needed for moderate pain. 07/26/19   Robyn Haber, MD  meloxicam (MOBIC) 7.5 MG tablet Take 1 tablet (7.5 mg total) by mouth daily as needed for pain. 12/17/17   Tasia Catchings, Amy V, PA-C  ondansetron (ZOFRAN ODT) 8 MG disintegrating tablet 1/2- 1 tablet q 8 hr prn nausea, vomiting 05/17/19   Melynda Ripple,  MD  zolpidem (AMBIEN) 10 MG tablet Take 10 mg by mouth at bedtime as needed.    [provider]    Family History Family History  Problem Relation Age of Onset  . Diabetes Mother   . Healthy Father     Social History Social History   Tobacco Use  . Smoking status: Never Smoker  . Smokeless tobacco: Never Used  Substance Use Topics  . Alcohol use: No  . Drug use: No     Allergies   Bactrim   Review of Systems Review of Systems  Musculoskeletal: Positive for arthralgias.  Neurological: Negative.   All other systems reviewed and are negative.    Physical Exam Triage Vital Signs ED Triage Vitals  Enc Vitals Group     BP 07/26/19 0958 (!) 154/94     Pulse Rate 07/26/19 0958 96     Resp 07/26/19 0958 16     Temp 07/26/19 0958 98.8 F (37.1 C)     Temp Source 07/26/19 0958 Oral     SpO2 07/26/19 0958 98 %     Weight --      Height --      Head Circumference --      Peak Flow --      Pain Score 07/26/19 0957 9  Pain Loc --      Pain Edu? --      Excl. in [place]? --    No data found.  Updated Vital Signs BP (!) 154/94 (BP Location: Left Arm)   Pulse 96   Temp 98.8 F (37.1 C) (Oral)   Resp 16   SpO2 98%    Physical Exam Vitals signs and nursing note reviewed.  Constitutional:      Appearance: Normal appearance. She is obese. She is not ill-appearing or toxic-appearing.  HENT:     Head: Atraumatic.  Eyes:     Conjunctiva/sclera: Conjunctivae normal.  Neck:     Musculoskeletal: Normal range of motion and neck supple.  Cardiovascular:     Rate and Rhythm: Normal rate.  Pulmonary:     Effort: Pulmonary effort is normal.  Abdominal:     Palpations: Abdomen is soft.     Tenderness: There is no abdominal tenderness.  Musculoskeletal:        General: Tenderness and signs of injury present. No swelling or deformity.     Comments: Able to protract shoulder about 60 degrees without pain.  Skin:    General: Skin is warm and dry.   Neurological:     General: No focal deficit present.     Mental Status: She is alert and oriented to person, place, and time.  Psychiatric:        Mood and Affect: Mood normal.        Behavior: Behavior normal.        Thought Content: Thought content normal.      UC Treatments / Results  Labs (all labs ordered are listed, but only abnormal results are displayed) Labs Reviewed - No data to display  EKG   Radiology Dg Shoulder Right  Result Date: 07/26/2019 CLINICAL DATA:  Right shoulder pain for 1 day since a fall. Initial encounter. EXAM: RIGHT SHOULDER - 2+ VIEW COMPARISON:  None. FINDINGS: There is no evidence of fracture or dislocation. There is no evidence of arthropathy or other focal bone abnormality. Soft tissues are unremarkable. IMPRESSION: Negative exam. Electronically Signed   By: Inge Rise M.D.   On: 07/26/2019 10:40    Procedures Procedures (including critical care time)  Medications Ordered in UC Medications - No data to display  Initial Impression / Assessment and Plan / UC Course  I have reviewed the triage vital signs and the nursing notes.  Pertinent labs & imaging results that were available during my care of the patient were reviewed by me and considered in my medical decision making (see chart for details).    Final Clinical Impressions(s) / UC Diagnoses   Final diagnoses:  Strain of right shoulder, initial encounter     Discharge Instructions     Gently rotate your right arm several times a day to keep flexibility    ED Prescriptions    Medication Sig Dispense Auth. Provider   diclofenac (VOLTAREN) 75 MG EC tablet Take 1 tablet (75 mg total) by mouth 2 (two) times daily. 14 tablet Robyn Haber, MD   HYDROcodone-acetaminophen (NORCO) 5-325 MG tablet Take 1 tablet by mouth every 6 (six) hours as needed for moderate pain. 20 tablet Robyn Haber, MD     I have reviewed the PDMP during this encounter.   Robyn Haber, MD  07/26/19 1046    Robyn Haber, MD 07/26/19 1050

## 2019-10-03 ENCOUNTER — Inpatient Hospital Stay (HOSPITAL_COMMUNITY)
Admission: EM | Admit: 2019-10-03 | Discharge: 2019-10-07 | DRG: 683 | Disposition: A | Discharge status: Home / Self Care | Payer: Medicare Other | Attending: Internal Medicine | Admitting: Internal Medicine

## 2019-10-03 ENCOUNTER — Other Ambulatory Visit: Payer: Self-pay

## 2019-10-03 ENCOUNTER — Ambulatory Visit (HOSPITAL_COMMUNITY)
Admission: EM | Admit: 2019-10-03 | Discharge: 2019-10-03 | Disposition: A | Discharge status: Home / Self Care | Payer: Medicare Other | Source: Home / Self Care

## 2019-10-03 ENCOUNTER — Encounter (HOSPITAL_COMMUNITY): Payer: Self-pay

## 2019-10-03 DIAGNOSIS — E876 Hypokalemia: Secondary | ICD-10-CM | POA: Diagnosis present

## 2019-10-03 DIAGNOSIS — I959 Hypotension, unspecified: Secondary | ICD-10-CM | POA: Diagnosis present

## 2019-10-03 DIAGNOSIS — E872 Acidosis: Secondary | ICD-10-CM | POA: Diagnosis present

## 2019-10-03 DIAGNOSIS — Z881 Allergy status to other antibiotic agents status: Secondary | ICD-10-CM

## 2019-10-03 DIAGNOSIS — Z20822 Contact with and (suspected) exposure to covid-19: Secondary | ICD-10-CM | POA: Diagnosis present

## 2019-10-03 DIAGNOSIS — R112 Nausea with vomiting, unspecified: Secondary | ICD-10-CM | POA: Diagnosis present

## 2019-10-03 DIAGNOSIS — Z90710 Acquired absence of both cervix and uterus: Secondary | ICD-10-CM

## 2019-10-03 DIAGNOSIS — Z79899 Other long term (current) drug therapy: Secondary | ICD-10-CM

## 2019-10-03 DIAGNOSIS — I10 Essential (primary) hypertension: Secondary | ICD-10-CM | POA: Diagnosis present

## 2019-10-03 DIAGNOSIS — G894 Chronic pain syndrome: Secondary | ICD-10-CM | POA: Diagnosis present

## 2019-10-03 DIAGNOSIS — M79601 Pain in right arm: Secondary | ICD-10-CM

## 2019-10-03 DIAGNOSIS — R739 Hyperglycemia, unspecified: Secondary | ICD-10-CM | POA: Diagnosis present

## 2019-10-03 DIAGNOSIS — N179 Acute kidney failure, unspecified: Secondary | ICD-10-CM | POA: Diagnosis not present

## 2019-10-03 DIAGNOSIS — E860 Dehydration: Secondary | ICD-10-CM

## 2019-10-03 DIAGNOSIS — M25511 Pain in right shoulder: Secondary | ICD-10-CM | POA: Diagnosis present

## 2019-10-03 DIAGNOSIS — N390 Urinary tract infection, site not specified: Secondary | ICD-10-CM | POA: Diagnosis present

## 2019-10-03 HISTORY — DX: Unspecified viral hepatitis C without hepatic coma: B19.20

## 2019-10-03 LAB — COMPREHENSIVE METABOLIC PANEL
ALT: 12 U/L (ref 0–44)
AST: 39 U/L (ref 15–41)
Albumin: 4.7 g/dL (ref 3.5–5.0)
Alkaline Phosphatase: 72 U/L (ref 38–126)
Anion gap: 18 — ABNORMAL HIGH (ref 5–15)
BUN: 17 mg/dL (ref 8–23)
CO2: 17 mmol/L — ABNORMAL LOW (ref 22–32)
Calcium: 9.7 mg/dL (ref 8.9–10.3)
Chloride: 105 mmol/L (ref 98–111)
Creatinine, Ser: 1.25 mg/dL — ABNORMAL HIGH (ref 0.44–1.00)
GFR calc Af Amer: 51 mL/min — ABNORMAL LOW (ref >60)
GFR calc non Af Amer: 44 mL/min — ABNORMAL LOW (ref >60)
Glucose, Bld: 188 mg/dL — ABNORMAL HIGH (ref 70–99)
Potassium: 4.4 mmol/L (ref 3.5–5.1)
Sodium: 140 mmol/L (ref 135–145)
Total Bilirubin: 1.5 mg/dL — ABNORMAL HIGH (ref 0.3–1.2)
Total Protein: 8.4 g/dL — ABNORMAL HIGH (ref 6.5–8.1)

## 2019-10-03 LAB — URINALYSIS, ROUTINE W REFLEX MICROSCOPIC
Bilirubin Urine: NEGATIVE
Glucose, UA: NEGATIVE mg/dL
Hgb urine dipstick: NEGATIVE
Ketones, ur: 5 mg/dL — AB
Nitrite: NEGATIVE
Protein, ur: 100 mg/dL — AB
Specific Gravity, Urine: 1.025 (ref 1.005–1.030)
pH: 5 (ref 5.0–8.0)

## 2019-10-03 LAB — CBC
HCT: 46.1 % — ABNORMAL HIGH (ref 36.0–46.0)
Hemoglobin: 14.2 g/dL (ref 12.0–15.0)
MCH: 26.5 pg (ref 26.0–34.0)
MCHC: 30.8 g/dL (ref 30.0–36.0)
MCV: 86 fL (ref 80.0–100.0)
Platelets: 335 10*3/uL (ref 150–400)
RBC: 5.36 MIL/uL — ABNORMAL HIGH (ref 3.87–5.11)
RDW: 14 % (ref 11.5–15.5)
WBC: 12.4 10*3/uL — ABNORMAL HIGH (ref 4.0–10.5)
nRBC: 0 % (ref 0.0–0.2)

## 2019-10-03 LAB — LIPASE, BLOOD: Lipase: 17 U/L (ref 11–51)

## 2019-10-03 MED ORDER — SODIUM CHLORIDE 0.9 % IV BOLUS
1000.0000 mL | Freq: Once | INTRAVENOUS | Status: AC
  Administered 2019-10-03: 22:00:00 1000 mL via INTRAVENOUS

## 2019-10-03 MED ORDER — KETOROLAC TROMETHAMINE 30 MG/ML IJ SOLN
30.0000 mg | Freq: Once | INTRAMUSCULAR | Status: AC
  Administered 2019-10-03: 22:00:00 30 mg via INTRAVENOUS
  Filled 2019-10-03: qty 1

## 2019-10-03 MED ORDER — ACETAMINOPHEN 325 MG PO TABS
650.0000 mg | ORAL_TABLET | Freq: Once | ORAL | Status: AC
  Administered 2019-10-03: 22:00:00 650 mg via ORAL
  Filled 2019-10-03: qty 2

## 2019-10-03 MED ORDER — PROMETHAZINE HCL 25 MG/ML IJ SOLN
12.5000 mg | Freq: Once | INTRAMUSCULAR | Status: AC
  Administered 2019-10-03: 12.5 mg via INTRAVENOUS
  Filled 2019-10-03: qty 1

## 2019-10-03 MED ORDER — ONDANSETRON HCL 4 MG/2ML IJ SOLN
4.0000 mg | Freq: Once | INTRAMUSCULAR | Status: AC
  Administered 2019-10-03: 4 mg via INTRAVENOUS
  Filled 2019-10-03: qty 2

## 2019-10-03 NOTE — ED Notes (Signed)
Patient LWBS. Pt told registration that she wants an IV infusion to treat her dehydration and did not want to wait here.
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
## 2019-10-03 NOTE — ED Notes (Signed)
All meds and fluid bolus given per MAR. Name/DOB verified with pt. Tolerated well.
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
## 2019-10-03 NOTE — ED Notes (Signed)
Pt given ginger ale to sip on.

## 2019-10-04 ENCOUNTER — Encounter (HOSPITAL_COMMUNITY): Payer: Self-pay | Admitting: Internal Medicine

## 2019-10-04 DIAGNOSIS — N179 Acute kidney failure, unspecified: Principal | ICD-10-CM

## 2019-10-04 DIAGNOSIS — I959 Hypotension, unspecified: Secondary | ICD-10-CM | POA: Diagnosis present

## 2019-10-04 DIAGNOSIS — G894 Chronic pain syndrome: Secondary | ICD-10-CM | POA: Diagnosis present

## 2019-10-04 DIAGNOSIS — M25511 Pain in right shoulder: Secondary | ICD-10-CM | POA: Diagnosis present

## 2019-10-04 DIAGNOSIS — R112 Nausea with vomiting, unspecified: Secondary | ICD-10-CM | POA: Diagnosis not present

## 2019-10-04 DIAGNOSIS — Z20822 Contact with and (suspected) exposure to covid-19: Secondary | ICD-10-CM | POA: Diagnosis present

## 2019-10-04 DIAGNOSIS — Z79899 Other long term (current) drug therapy: Secondary | ICD-10-CM | POA: Diagnosis not present

## 2019-10-04 DIAGNOSIS — E860 Dehydration: Secondary | ICD-10-CM | POA: Diagnosis present

## 2019-10-04 DIAGNOSIS — R739 Hyperglycemia, unspecified: Secondary | ICD-10-CM | POA: Diagnosis present

## 2019-10-04 DIAGNOSIS — Z881 Allergy status to other antibiotic agents status: Secondary | ICD-10-CM | POA: Diagnosis not present

## 2019-10-04 DIAGNOSIS — E872 Acidosis: Secondary | ICD-10-CM | POA: Diagnosis present

## 2019-10-04 DIAGNOSIS — E876 Hypokalemia: Secondary | ICD-10-CM | POA: Diagnosis present

## 2019-10-04 DIAGNOSIS — Z90710 Acquired absence of both cervix and uterus: Secondary | ICD-10-CM | POA: Diagnosis not present

## 2019-10-04 DIAGNOSIS — I10 Essential (primary) hypertension: Secondary | ICD-10-CM | POA: Diagnosis present

## 2019-10-04 DIAGNOSIS — N390 Urinary tract infection, site not specified: Secondary | ICD-10-CM | POA: Diagnosis present

## 2019-10-04 LAB — BASIC METABOLIC PANEL
Anion gap: 15 (ref 5–15)
BUN: 20 mg/dL (ref 8–23)
CO2: 20 mmol/L — ABNORMAL LOW (ref 22–32)
Calcium: 9.3 mg/dL (ref 8.9–10.3)
Chloride: 107 mmol/L (ref 98–111)
Creatinine, Ser: 1.25 mg/dL — ABNORMAL HIGH (ref 0.44–1.00)
GFR calc Af Amer: 51 mL/min — ABNORMAL LOW (ref >60)
GFR calc non Af Amer: 44 mL/min — ABNORMAL LOW (ref >60)
Glucose, Bld: 158 mg/dL — ABNORMAL HIGH (ref 70–99)
Potassium: 3.7 mmol/L (ref 3.5–5.1)
Sodium: 142 mmol/L (ref 135–145)

## 2019-10-04 LAB — HEPATIC FUNCTION PANEL
ALT: 23 U/L (ref 0–44)
AST: 20 U/L (ref 15–41)
Albumin: 4.3 g/dL (ref 3.5–5.0)
Alkaline Phosphatase: 70 U/L (ref 38–126)
Bilirubin, Direct: 0.2 mg/dL (ref 0.0–0.2)
Indirect Bilirubin: 0.4 mg/dL (ref 0.3–0.9)
Total Bilirubin: 0.6 mg/dL (ref 0.3–1.2)
Total Protein: 8.5 g/dL — ABNORMAL HIGH (ref 6.5–8.1)

## 2019-10-04 LAB — CBC WITH DIFFERENTIAL/PLATELET
Abs Immature Granulocytes: 0.06 10*3/uL (ref 0.00–0.07)
Basophils Absolute: 0 10*3/uL (ref 0.0–0.1)
Basophils Relative: 0 %
Eosinophils Absolute: 0 10*3/uL (ref 0.0–0.5)
Eosinophils Relative: 0 %
HCT: 43 % (ref 36.0–46.0)
Hemoglobin: 13.8 g/dL (ref 12.0–15.0)
Immature Granulocytes: 0 %
Lymphocytes Relative: 15 %
Lymphs Abs: 2.1 10*3/uL (ref 0.7–4.0)
MCH: 26.8 pg (ref 26.0–34.0)
MCHC: 32.1 g/dL (ref 30.0–36.0)
MCV: 83.5 fL (ref 80.0–100.0)
Monocytes Absolute: 0.9 10*3/uL (ref 0.1–1.0)
Monocytes Relative: 7 %
Neutro Abs: 11.3 10*3/uL — ABNORMAL HIGH (ref 1.7–7.7)
Neutrophils Relative %: 78 %
Platelets: 319 10*3/uL (ref 150–400)
RBC: 5.15 MIL/uL — ABNORMAL HIGH (ref 3.87–5.11)
RDW: 14.1 % (ref 11.5–15.5)
WBC: 14.5 10*3/uL — ABNORMAL HIGH (ref 4.0–10.5)
nRBC: 0 % (ref 0.0–0.2)

## 2019-10-04 LAB — RAPID URINE DRUG SCREEN, HOSP PERFORMED
Amphetamines: NOT DETECTED
Barbiturates: NOT DETECTED
Benzodiazepines: NOT DETECTED
Cocaine: NOT DETECTED
Opiates: NOT DETECTED
Tetrahydrocannabinol: NOT DETECTED

## 2019-10-04 LAB — HEMOGLOBIN A1C
Hgb A1c MFr Bld: 6.4 % — ABNORMAL HIGH (ref 4.8–5.6)
Mean Plasma Glucose: 136.98 mg/dL

## 2019-10-04 LAB — SARS CORONAVIRUS 2 (TAT 6-24 HRS): SARS Coronavirus 2: NEGATIVE

## 2019-10-04 LAB — URINE CULTURE: Culture: >=100000 — AB

## 2019-10-04 LAB — HIV ANTIBODY (ROUTINE TESTING W REFLEX): HIV Screen 4th Generation wRfx: NONREACTIVE — AB

## 2019-10-04 LAB — LACTIC ACID, PLASMA: Lactic Acid, Venous: 1.7 mmol/L (ref 0.5–1.9)

## 2019-10-04 MED ORDER — ACETAMINOPHEN 325 MG PO TABS: 650.0000 mg | ORAL_TABLET | Freq: Four times a day (QID) | ORAL | Status: DC | PRN

## 2019-10-04 MED ORDER — ZOLPIDEM TARTRATE 5 MG PO TABS
5.0000 mg | ORAL_TABLET | Freq: Once | ORAL | Status: AC
  Administered 2019-10-04: 04:00:00 5 mg via ORAL
  Filled 2019-10-04: qty 1

## 2019-10-04 MED ORDER — SODIUM CHLORIDE 0.9 % IV SOLN
1.0000 g | Freq: Every day | INTRAVENOUS | Status: DC
  Administered 2019-10-04 – 2019-10-06 (×3): 1 g via INTRAVENOUS
  Filled 2019-10-04 (×3): qty 1

## 2019-10-04 MED ORDER — ZOLPIDEM TARTRATE 5 MG PO TABS
5.0000 mg | ORAL_TABLET | Freq: Every evening | ORAL | Status: DC | PRN
  Administered 2019-10-04 – 2019-10-06 (×3): 5 mg via ORAL
  Filled 2019-10-04 (×3): qty 1

## 2019-10-04 MED ORDER — MORPHINE SULFATE (PF) 2 MG/ML IV SOLN
1.0000 mg | INTRAVENOUS | Status: DC | PRN
  Administered 2019-10-04 – 2019-10-07 (×14): 1 mg via INTRAVENOUS
  Filled 2019-10-04 (×14): qty 1

## 2019-10-04 MED ORDER — SODIUM CHLORIDE 0.9 % IV SOLN: INTRAVENOUS | Status: AC

## 2019-10-04 MED ORDER — ONDANSETRON HCL 4 MG PO TABS: 4.0000 mg | ORAL_TABLET | Freq: Four times a day (QID) | ORAL | Status: DC | PRN

## 2019-10-04 MED ORDER — HYDRALAZINE HCL 20 MG/ML IJ SOLN
10.0000 mg | INTRAMUSCULAR | Status: DC | PRN
  Administered 2019-10-04 – 2019-10-05 (×2): 10 mg via INTRAVENOUS
  Filled 2019-10-04 (×2): qty 1

## 2019-10-04 MED ORDER — ONDANSETRON HCL 4 MG/2ML IJ SOLN
4.0000 mg | Freq: Four times a day (QID) | INTRAMUSCULAR | Status: DC | PRN
  Administered 2019-10-04 – 2019-10-07 (×6): 4 mg via INTRAVENOUS
  Filled 2019-10-04 (×6): qty 2

## 2019-10-04 MED ORDER — PROMETHAZINE HCL 25 MG/ML IJ SOLN: 12.5000 mg | Freq: Four times a day (QID) | INTRAMUSCULAR | Status: DC | PRN

## 2019-10-04 MED ORDER — ACETAMINOPHEN 650 MG RE SUPP: 650.0000 mg | Freq: Four times a day (QID) | RECTAL | Status: DC | PRN

## 2019-10-04 MED ORDER — DICLOFENAC SODIUM 1 % EX GEL
2.0000 g | Freq: Four times a day (QID) | CUTANEOUS | Status: DC
  Administered 2019-10-04 – 2019-10-06 (×10): 2 g via TOPICAL
  Filled 2019-10-04: qty 100

## 2019-10-04 MED ORDER — ENOXAPARIN SODIUM 40 MG/0.4ML ~~LOC~~ SOLN
40.0000 mg | SUBCUTANEOUS | Status: DC
  Administered 2019-10-04 – 2019-10-07 (×4): 40 mg via SUBCUTANEOUS
  Filled 2019-10-04 (×4): qty 0.4
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
## 2019-10-04 NOTE — Progress Notes (Signed)
Patient admitted to [phone] .Alert and oriented x4. Vital signs stable. Skin intact. Denies c/o pain. Oriented to room and remote.

## 2019-10-04 NOTE — Progress Notes (Signed)
Patient placed in observation after midnight.  Patient's care began in the ER prior to midnight.  Patient continues to complain of nausea and pain.  Only able to take a few sips of liquids.  We will continue nausea medicine, pain control, and IV fluids.  Await urine culture to be able to de-escalate antibiotics. Eulogio Bear

## 2019-10-04 NOTE — ED Notes (Signed)
Pt failed PO challenge. Provider notified.

## 2019-10-04 NOTE — Plan of Care (Signed)
  Problem: Health Behavior/Discharge Planning: Goal: Ability to manage health-related needs will improve Outcome: Progressing   

## 2019-10-04 NOTE — Plan of Care (Signed)
  Problem: Education: Goal: Knowledge of General Education information will improve Description Including pain rating scale, medication(s)/side effects and non-pharmacologic comfort measures Outcome: Progressing   

## 2019-10-04 NOTE — ED Notes (Signed)
Attempted to call nursing report.  

## 2019-10-05 LAB — CBC
HCT: 41.8 % (ref 36.0–46.0)
Hemoglobin: 13.3 g/dL (ref 12.0–15.0)
MCH: 26.5 pg (ref 26.0–34.0)
MCHC: 31.8 g/dL (ref 30.0–36.0)
MCV: 83.4 fL (ref 80.0–100.0)
Platelets: 295 10*3/uL (ref 150–400)
RBC: 5.01 MIL/uL (ref 3.87–5.11)
RDW: 13.9 % (ref 11.5–15.5)
WBC: 9.5 10*3/uL (ref 4.0–10.5)
nRBC: 0 % (ref 0.0–0.2)

## 2019-10-05 LAB — BASIC METABOLIC PANEL
Anion gap: 13 (ref 5–15)
BUN: 12 mg/dL (ref 8–23)
CO2: 20 mmol/L — ABNORMAL LOW (ref 22–32)
Calcium: 8.8 mg/dL — ABNORMAL LOW (ref 8.9–10.3)
Chloride: 106 mmol/L (ref 98–111)
Creatinine, Ser: 0.86 mg/dL (ref 0.44–1.00)
GFR calc Af Amer: >60 mL/min (ref >60)
GFR calc non Af Amer: >60 mL/min (ref >60)
Glucose, Bld: 140 mg/dL — ABNORMAL HIGH (ref 70–99)
Potassium: 3.2 mmol/L — ABNORMAL LOW (ref 3.5–5.1)
Sodium: 139 mmol/L (ref 135–145)

## 2019-10-05 MED ORDER — AMLODIPINE BESYLATE 5 MG PO TABS
5.0000 mg | ORAL_TABLET | Freq: Every day | ORAL | Status: DC
  Administered 2019-10-05 – 2019-10-07 (×3): 5 mg via ORAL
  Filled 2019-10-05 (×3): qty 1

## 2019-10-05 MED ORDER — AMLODIPINE BESYLATE-VALSARTAN 5-320 MG PO TABS: 1.0000 | ORAL_TABLET | Freq: Every day | ORAL | Status: DC

## 2019-10-05 MED ORDER — ACETAMINOPHEN-CODEINE #3 300-30 MG PO TABS
1.0000 | ORAL_TABLET | Freq: Four times a day (QID) | ORAL | Status: DC | PRN
  Administered 2019-10-05 – 2019-10-07 (×7): 1 via ORAL
  Filled 2019-10-05 (×8): qty 1

## 2019-10-05 MED ORDER — IRBESARTAN 300 MG PO TABS
300.0000 mg | ORAL_TABLET | Freq: Every day | ORAL | Status: DC
  Administered 2019-10-05 – 2019-10-07 (×3): 300 mg via ORAL
  Filled 2019-10-05 (×3): qty 1

## 2019-10-05 MED ORDER — POTASSIUM CHLORIDE CRYS ER 20 MEQ PO TBCR
40.0000 meq | EXTENDED_RELEASE_TABLET | Freq: Two times a day (BID) | ORAL | Status: DC
  Administered 2019-10-05 – 2019-10-06 (×3): 40 meq via ORAL
  Filled 2019-10-05 (×3): qty 2

## 2019-10-05 NOTE — Plan of Care (Signed)
°  Problem: Education: °Goal: Knowledge of General Education information will improve °Description: Including pain rating scale, medication(s)/side effects and non-pharmacologic comfort measures °Outcome: Progressing °  °Problem: Health Behavior/Discharge Planning: °Goal: Ability to manage health-related needs will improve °Outcome: Progressing °  °Problem: Clinical Measurements: °Goal: Ability to maintain clinical measurements within normal limits will improve °Outcome: Progressing °Goal: Respiratory complications will improve °Outcome: Progressing °Goal: Cardiovascular complication will be avoided °Outcome: Progressing °  °Problem: Activity: °Goal: Risk for activity intolerance will decrease °Outcome: Progressing °  °Problem: Nutrition: °Goal: Adequate nutrition will be maintained °Outcome: Progressing °  °Problem: Elimination: °Goal: Will not experience complications related to bowel motility °Outcome: Progressing °Goal: Will not experience complications related to urinary retention °Outcome: Progressing °  °Problem: Pain Managment: °Goal: General experience of comfort will improve °Outcome: Progressing °  °Problem: Safety: °Goal: Ability to remain free from injury will improve °Outcome: Progressing °  °Problem: Skin Integrity: °Goal: Risk for impaired skin integrity will decrease °Outcome: Progressing °  °

## 2019-10-05 NOTE — Progress Notes (Signed)
PROGRESS NOTE    Jennifer Trevino  [id number] DOB: Apr 24, 1951 DOA: 10/03/2019 PCP: Charolette Forward, MD    Brief Narrative:  69 year old female with history of hypertension who presented to the emergency room nausea and vomiting of 1 day.  Patient apparently had multiple injuries recently and was taking hydrocodone for pain relief which she ran out 2 days ago.  In the emergency room she was tachycardic and hypotensive and low-grade fever.  Abdomen was benign.  Urinalysis consistent with UTI.  Creatinine 1.25.  WBC 12.4.  COVID-19 negative.  Due to significant symptoms admitted hospital.   Assessment & Plan:   Principal Problem:   Nausea & vomiting Active Problems:   ARF (acute renal failure) (HCC)  Nausea and vomiting: Intractable.  Probably due to UTI.  Bowel functions normal. Continue nausea medications and IV fluids. Challenged with soft diet today, she could not tolerate, changed to full liquid diet. Unlikely narcotic withdrawals.  Acute UTI present on admission: Growing gram-negative.  Continue on Rocephin pending final cultures.  Acute kidney injury: Due to above.  Treated with IV fluids with improvement.  Hypertension: Resume amlodipine valsartan.  Stable.  Hypokalemia: Replace and monitor levels.  Also check magnesium phosphorus with morning labs.  Chronic pain syndrome: With recent traumas.  She will use Tylenol and Tylenol 3 for pain control.  Also use local diclofenac.   DVT prophylaxis: Lovenox subcu Code Status: Full code Family Communication: None Disposition Plan: patient is from home. Anticipated DC to home, Barriers to discharge still nauseated, could not tolerate oral challenge.   Consultants:   None  Procedures:   None  Antimicrobials:  Antibiotics Given (last 72 hours)    Date/Time Action Medication Dose Rate   10/04/19 0526 New Bag/Given   cefTRIAXone (ROCEPHIN) 1 g in sodium chloride 0.9 % 100 mL IVPB 1 g 200 mL/hr   10/05/19 1009 New Bag/Given     cefTRIAXone (ROCEPHIN) 1 g in sodium chloride 0.9 % 100 mL IVPB 1 g 200 mL/hr         Subjective: Patient seen and examined.  No overnight events.  Still occasionally feeling nauseated. Patient also complained of right shoulder pain and right hip pain. Nausea recurred after trying lunch.  Objective: Vitals:   10/04/19 1356 10/04/19 1524 10/04/19 2039 10/05/19 0357  BP: (!) 175/100 (!) 152/85 (!) 161/86 (!) 144/81  Pulse: 99 99 96 98  Resp: 16 18 17 17   Temp: 99.7 F (37.6 C) 99.7 F (37.6 C) 99.3 F (37.4 C) 99.8 F (37.7 C)  TempSrc: Oral Oral Oral Oral  SpO2: 98% 100% 99% 98%  Weight:      Height:        Intake/Output Summary (Last 24 hours) at 10/05/2019 1414 Last data filed at 10/04/2019 1700 Gross per 24 hour  Intake 480 ml  Output --  Net 480 ml   Filed Weights   10/03/19 1450  Weight: 81.6 kg    Examination:  General exam: Appears calm and comfortable, comfortable at rest. Respiratory system: Clear to auscultation. Respiratory effort normal. Cardiovascular system: S1 & S2 heard, RRR. No JVD, murmurs, rubs, gallops or clicks. No pedal edema. Gastrointestinal system: Abdomen is nondistended, soft and nontender. No organomegaly or masses felt. Normal bowel sounds heard. Central nervous system: Alert and oriented. No focal neurological deficits. Extremities: Symmetric 5 x 5 power. Skin: No rashes, lesions or ulcers Psychiatry: Judgement and insight appear normal. Mood & affect appropriate.     Data Reviewed: I have personally reviewed  following labs and imaging studies  CBC: Recent Labs  Lab 10/03/19 1542 10/04/19 0614 10/05/19 0123  WBC 12.4* 14.5* 9.5  NEUTROABS  --  11.3*  --   HGB 14.2 13.8 13.3  HCT 46.1* 43.0 41.8  MCV 86.0 83.5 83.4  PLT 335 319 [id number]   Basic Metabolic Panel: Recent Labs  Lab 10/03/19 1542 10/04/19 0614 10/05/19 0123  NA 140 142 139  K 4.4 3.7 3.2*  CL 105 107 106  CO2 17* 20* 20*  GLUCOSE 188* 158* 140*  BUN 17 20  12   CREATININE 1.25* 1.25* 0.86  CALCIUM 9.7 9.3 8.8*   GFR: Estimated Creatinine Clearance: 65.1 mL/min (by C-G formula based on SCr of 0.86 mg/dL). Liver Function Tests: Recent Labs  Lab 10/03/19 1542 10/04/19 0614  AST 39 20  ALT 12 23  ALKPHOS 72 70  BILITOT 1.5* 0.6  PROT 8.4* 8.5*  ALBUMIN 4.7 4.3   Recent Labs  Lab 10/03/19 1542  LIPASE 17   No results for input(s): AMMONIA in the last 168 hours. Coagulation Profile: No results for input(s): INR, PROTIME in the last 168 hours. Cardiac Enzymes: No results for input(s): CKTOTAL, CKMB, CKMBINDEX, TROPONINI in the last 168 hours. BNP (last 3 results) No results for input(s): PROBNP in the last 8760 hours. HbA1C: Recent Labs    10/04/19 0614  HGBA1C 6.4*   CBG: No results for input(s): GLUCAP in the last 168 hours. Lipid Profile: No results for input(s): CHOL, HDL, LDLCALC, TRIG, CHOLHDL, LDLDIRECT in the last 72 hours. Thyroid Function Tests: No results for input(s): TSH, T4TOTAL, FREET4, T3FREE, THYROIDAB in the last 72 hours. Anemia Panel: No results for input(s): VITAMINB12, FOLATE, FERRITIN, TIBC, IRON, RETICCTPCT in the last 72 hours. Sepsis Labs: Recent Labs  Lab 10/04/19 [id number]  LATICACIDVEN 1.7    Recent Results (from the past 240 hour(s))  SARS CORONAVIRUS 2 (TAT 6-24 HRS) Nasopharyngeal Nasopharyngeal Swab     Status: None   Collection Time: 10/04/19  1:05 AM   Specimen: Nasopharyngeal Swab  Result Value Ref Range Status   SARS Coronavirus 2 NEGATIVE NEGATIVE Final    Comment: (NOTE) SARS-CoV-2 target nucleic acids are NOT DETECTED. The SARS-CoV-2 RNA is generally detectable in upper and lower respiratory specimens during the acute phase of infection. Negative results do not preclude SARS-CoV-2 infection, do not rule out co-infections with other pathogens, and should not be used as the sole basis for treatment or other patient management decisions. Negative results must be combined with  clinical observations, patient history, and epidemiological information. The expected result is Negative. Fact Sheet for Patients: SugarRoll.be Fact Sheet for Healthcare Providers: https://www.woods-mathews.com/ This test is not yet approved or cleared by the [place] FDA and  has been authorized for detection and/or diagnosis of SARS-CoV-2 by FDA under an Emergency Use Authorization (EUA). This EUA will remain  in effect (meaning this test can be used) for the duration of the COVID-19 declaration under Section 56 4(b)(1) of the Act, 21 U.S.C. section 360bbb-3(b)(1), unless the authorization is terminated or revoked sooner. Performed at [facility], [place] [address], [place], [place] [address]   Culture, Urine     Status: Abnormal   Collection Time: 10/04/19  2:15 AM   Specimen: Urine, Random  Result Value Ref Range Status   Specimen Description URINE, RANDOM  Final   Special Requests NONE  Final   Culture (A)  Final    >=100,000 COLONIES/mL LACTOBACILLUS SPECIES Standardized susceptibility testing for this  organism is not available. Performed at [facility], [place] [address], [place], [place] [address]    Report Status 10/04/2019 FINAL  Final  Culture, blood (routine x 2)     Status: None (Preliminary result)   Collection Time: 10/04/19  6:32 AM   Specimen: BLOOD RIGHT HAND  Result Value Ref Range Status   Specimen Description BLOOD RIGHT HAND  Final   Special Requests   Final    BOTTLES DRAWN AEROBIC ONLY Blood Culture results may not be optimal due to an inadequate volume of blood received in culture bottles   Culture   Final    NO GROWTH 1 DAY Performed at [facility], [place] [address], [place], [place] [address]    Report Status PENDING  Incomplete  Culture, blood (routine x 2)     Status: None (Preliminary result)   Collection Time: 10/04/19  6:39 AM   Specimen: BLOOD LEFT HAND  Result Value Ref Range Status    Specimen Description BLOOD LEFT HAND  Final   Special Requests   Final    BOTTLES DRAWN AEROBIC AND ANAEROBIC Blood Culture results may not be optimal due to an inadequate volume of blood received in culture bottles   Culture   Final    NO GROWTH 1 DAY Performed at [facility], [place] [address], [place], [place] [address]    Report Status PENDING  Incomplete         Radiology Studies: No results found.      Scheduled Meds: . diclofenac Sodium  2 g Topical QID  . enoxaparin (LOVENOX) injection  40 mg Subcutaneous Q24H  . potassium chloride  40 mEq Oral BID   Continuous Infusions: . cefTRIAXone (ROCEPHIN)  IV 1 g (10/05/19 1009)     LOS: 1 day    Time spent: 25 minutes    Barb Merino, MD [facility] Pager [phone]

## 2019-10-06 ENCOUNTER — Inpatient Hospital Stay (HOSPITAL_COMMUNITY): Payer: Medicare Other

## 2019-10-06 LAB — CBC WITH DIFFERENTIAL/PLATELET
Abs Immature Granulocytes: 0.03 10*3/uL (ref 0.00–0.07)
Basophils Absolute: 0.1 10*3/uL (ref 0.0–0.1)
Basophils Relative: 1 %
Eosinophils Absolute: 0.1 10*3/uL (ref 0.0–0.5)
Eosinophils Relative: 1 %
HCT: 42.5 % (ref 36.0–46.0)
Hemoglobin: 13.6 g/dL (ref 12.0–15.0)
Immature Granulocytes: 0 %
Lymphocytes Relative: 33 %
Lymphs Abs: 3.4 10*3/uL (ref 0.7–4.0)
MCH: 26.6 pg (ref 26.0–34.0)
MCHC: 32 g/dL (ref 30.0–36.0)
MCV: 83.2 fL (ref 80.0–100.0)
Monocytes Absolute: 0.8 10*3/uL (ref 0.1–1.0)
Monocytes Relative: 8 %
Neutro Abs: 6.1 10*3/uL (ref 1.7–7.7)
Neutrophils Relative %: 57 %
Platelets: 282 10*3/uL (ref 150–400)
RBC: 5.11 MIL/uL (ref 3.87–5.11)
RDW: 13.6 % (ref 11.5–15.5)
WBC: 10.6 10*3/uL — ABNORMAL HIGH (ref 4.0–10.5)
nRBC: 0.2 % (ref 0.0–0.2)

## 2019-10-06 LAB — BASIC METABOLIC PANEL
Anion gap: 13 (ref 5–15)
BUN: 11 mg/dL (ref 8–23)
CO2: 22 mmol/L (ref 22–32)
Calcium: 9.2 mg/dL (ref 8.9–10.3)
Chloride: 103 mmol/L (ref 98–111)
Creatinine, Ser: 0.89 mg/dL (ref 0.44–1.00)
GFR calc Af Amer: >60 mL/min (ref >60)
GFR calc non Af Amer: >60 mL/min (ref >60)
Glucose, Bld: 138 mg/dL — ABNORMAL HIGH (ref 70–99)
Potassium: 4 mmol/L (ref 3.5–5.1)
Sodium: 138 mmol/L (ref 135–145)

## 2019-10-06 LAB — MAGNESIUM: Magnesium: 2.3 mg/dL (ref 1.7–2.4)

## 2019-10-06 LAB — PHOSPHORUS: Phosphorus: 2.5 mg/dL (ref 2.5–4.6)

## 2019-10-06 MED ORDER — IOHEXOL 300 MG/ML  SOLN
100.0000 mL | Freq: Once | INTRAMUSCULAR | Status: AC | PRN
  Administered 2019-10-06: 13:00:00 100 mL via INTRAVENOUS

## 2019-10-06 NOTE — Progress Notes (Signed)
PROGRESS NOTE    Jennifer Trevino  [id number] DOB: 1950-09-17 DOA: 10/03/2019 PCP: Charolette Forward, MD    Brief Narrative:  69 year old female with history of hypertension who presented to the emergency room with nausea and vomiting of 1 day.  Patient apparently had multiple injuries recently and was taking hydrocodone for pain relief that she ran out 2 days ago.  In the emergency room, she was tachycardic and hypotensive with low-grade fever.  Abdomen was benign.  Urinalysis consistent with UTI.  Creatinine 1.25.  WBC 12.4.  COVID-19 negative.  Due to significant symptoms admitted hospital.   Assessment & Plan:   Principal Problem:   Nausea & vomiting Active Problems:   ARF (acute renal failure) (HCC)  Nausea and vomiting: Intractable.  Probably due to UTI.  Bowel functions normal. Continue nausea medications and IV fluids. Patient persistently nauseated with no vomiting.  Bowel movements normal. We will check CT scan of the abdomen pelvis to look for any structural changes.  Acute UTI present on admission: Growing gram-negative.  Continue on Rocephin pending final cultures.  Urine cultures pending.  Acute kidney injury: Due to above.  Treated with IV fluids with improvement.  Hypertension: Resume amlodipine valsartan.  Stable.  Hypokalemia: Replaced with improvement.  Magnesium and phosphorus normal.  Chronic pain syndrome: With recent traumas.  She will use Tylenol and Tylenol 3 for pain control.  Also use local diclofenac.   DVT prophylaxis: Lovenox subcu Code Status: Full code Family Communication: None Disposition Plan: patient is from home. Anticipated DC to home, Barriers to discharge still nauseated and unable to eat without nausea.   Consultants:   None  Procedures:   None  Antimicrobials:  Antibiotics Given (last 72 hours)    Date/Time Action Medication Dose Rate   10/04/19 0526 New Bag/Given   cefTRIAXone (ROCEPHIN) 1 g in sodium chloride 0.9 % 100 mL  IVPB 1 g 200 mL/hr   10/05/19 1009 New Bag/Given   cefTRIAXone (ROCEPHIN) 1 g in sodium chloride 0.9 % 100 mL IVPB 1 g 200 mL/hr   10/06/19 1022 New Bag/Given   cefTRIAXone (ROCEPHIN) 1 g in sodium chloride 0.9 % 100 mL IVPB 1 g 200 mL/hr         Subjective: Patient seen and examined.  No overnight events, however still remains nauseated. No abdominal pain.  She had a small bowel movement yesterday.  Objective: Vitals:   10/05/19 1426 10/05/19 2044 10/05/19 2139 10/06/19 0624  BP: (!) 146/78 (!) 159/102 (!) 154/75 140/90  Pulse: 80 100  (!) 109  Resp: 16 18  17   Temp: 99.8 F (37.7 C) 98.4 F (36.9 C)  98.9 F (37.2 C)  TempSrc: Oral   Oral  SpO2: 100% 97% 100% 99%  Weight:      Height:        Intake/Output Summary (Last 24 hours) at 10/06/2019 1112 Last data filed at 10/06/2019 1018 Gross per 24 hour  Intake 1120 ml  Output --  Net 1120 ml   Filed Weights   10/03/19 1450  Weight: 81.6 kg    Examination:  General exam: Appears calm but anxious with nausea. Respiratory system: Clear to auscultation. Respiratory effort normal. Cardiovascular system: S1 & S2 heard, RRR. No JVD, murmurs, rubs, gallops or clicks. No pedal edema. Gastrointestinal system: Abdomen is nondistended, soft and nontender. No organomegaly or masses felt. Normal bowel sounds heard. Central nervous system: Alert and oriented. No focal neurological deficits. Extremities: Symmetric 5 x 5 power. Skin: No rashes,  lesions or ulcers Psychiatry: Judgement and insight appear normal. Mood & affect anxious.    Data Reviewed: I have personally reviewed following labs and imaging studies  CBC: Recent Labs  Lab 10/03/19 1542 10/04/19 0614 10/05/19 0123 10/06/19 0205  WBC 12.4* 14.5* 9.5 10.6*  NEUTROABS  --  11.3*  --  6.1  HGB 14.2 13.8 13.3 13.6  HCT 46.1* 43.0 41.8 42.5  MCV 86.0 83.5 83.4 83.2  PLT 335 319 295 [id number]   Basic Metabolic Panel: Recent Labs  Lab 10/03/19 1542 10/04/19 0614  10/05/19 0123 10/06/19 0205  NA 140 142 139 138  K 4.4 3.7 3.2* 4.0  CL 105 107 106 103  CO2 17* 20* 20* 22  GLUCOSE 188* 158* 140* 138*  BUN 17 20 12 11   CREATININE 1.25* 1.25* 0.86 0.89  CALCIUM 9.7 9.3 8.8* 9.2  MG  --   --   --  2.3  PHOS  --   --   --  2.5   GFR: Estimated Creatinine Clearance: 62.9 mL/min (by C-G formula based on SCr of 0.89 mg/dL). Liver Function Tests: Recent Labs  Lab 10/03/19 1542 10/04/19 0614  AST 39 20  ALT 12 23  ALKPHOS 72 70  BILITOT 1.5* 0.6  PROT 8.4* 8.5*  ALBUMIN 4.7 4.3   Recent Labs  Lab 10/03/19 1542  LIPASE 17   No results for input(s): AMMONIA in the last 168 hours. Coagulation Profile: No results for input(s): INR, PROTIME in the last 168 hours. Cardiac Enzymes: No results for input(s): CKTOTAL, CKMB, CKMBINDEX, TROPONINI in the last 168 hours. BNP (last 3 results) No results for input(s): PROBNP in the last 8760 hours. HbA1C: Recent Labs    10/04/19 0614  HGBA1C 6.4*   CBG: No results for input(s): GLUCAP in the last 168 hours. Lipid Profile: No results for input(s): CHOL, HDL, LDLCALC, TRIG, CHOLHDL, LDLDIRECT in the last 72 hours. Thyroid Function Tests: No results for input(s): TSH, T4TOTAL, FREET4, T3FREE, THYROIDAB in the last 72 hours. Anemia Panel: No results for input(s): VITAMINB12, FOLATE, FERRITIN, TIBC, IRON, RETICCTPCT in the last 72 hours. Sepsis Labs: Recent Labs  Lab 10/04/19 [id number]  LATICACIDVEN 1.7    Recent Results (from the past 240 hour(s))  SARS CORONAVIRUS 2 (TAT 6-24 HRS) Nasopharyngeal Nasopharyngeal Swab     Status: None   Collection Time: 10/04/19  1:05 AM   Specimen: Nasopharyngeal Swab  Result Value Ref Range Status   SARS Coronavirus 2 NEGATIVE NEGATIVE Final    Comment: (NOTE) SARS-CoV-2 target nucleic acids are NOT DETECTED. The SARS-CoV-2 RNA is generally detectable in upper and lower respiratory specimens during the acute phase of infection. Negative results do not  preclude SARS-CoV-2 infection, do not rule out co-infections with other pathogens, and should not be used as the sole basis for treatment or other patient management decisions. Negative results must be combined with clinical observations, patient history, and epidemiological information. The expected result is Negative. Fact Sheet for Patients: SugarRoll.be Fact Sheet for Healthcare Providers: https://www.woods-mathews.com/ This test is not yet approved or cleared by the [place] FDA and  has been authorized for detection and/or diagnosis of SARS-CoV-2 by FDA under an Emergency Use Authorization (EUA). This EUA will remain  in effect (meaning this test can be used) for the duration of the COVID-19 declaration under Section 56 4(b)(1) of the Act, 21 U.S.C. section 360bbb-3(b)(1), unless the authorization is terminated or revoked sooner. Performed at [facility], [place],  [place] [address]   Culture, Urine     Status: Abnormal   Collection Time: 10/04/19  2:15 AM   Specimen: Urine, Random  Result Value Ref Range Status   Specimen Description URINE, RANDOM  Final   Special Requests NONE  Final   Culture (A)  Final    >=100,000 COLONIES/mL LACTOBACILLUS SPECIES Standardized susceptibility testing for this organism is not available. Performed at [facility], [place] [address] [place], [place] [address]    Report Status 10/04/2019 FINAL  Final  Culture, blood (routine x 2)     Status: None (Preliminary result)   Collection Time: 10/04/19  6:32 AM   Specimen: BLOOD RIGHT HAND  Result Value Ref Range Status   Specimen Description BLOOD RIGHT HAND  Final   Special Requests   Final    BOTTLES DRAWN AEROBIC ONLY Blood Culture results may not be optimal due to an inadequate volume of blood received in culture bottles   Culture   Final    NO GROWTH 1 DAY Performed at [facility], [place] [address],  [place], [place] [address]    Report Status PENDING  Incomplete  Culture, blood (routine x 2)     Status: None (Preliminary result)   Collection Time: 10/04/19  6:39 AM   Specimen: BLOOD LEFT HAND  Result Value Ref Range Status   Specimen Description BLOOD LEFT HAND  Final   Special Requests   Final    BOTTLES DRAWN AEROBIC AND ANAEROBIC Blood Culture results may not be optimal due to an inadequate volume of blood received in culture bottles   Culture   Final    NO GROWTH 1 DAY Performed at [facility], [place] [address], [place], [place] [address]    Report Status PENDING  Incomplete         Radiology Studies: No results found.      Scheduled Meds: . amLODipine  5 mg Oral Daily   And  . irbesartan  300 mg Oral Daily  . diclofenac Sodium  2 g Topical QID  . enoxaparin (LOVENOX) injection  40 mg Subcutaneous Q24H  . potassium chloride  40 mEq Oral BID   Continuous Infusions: . cefTRIAXone (ROCEPHIN)  IV 1 g (10/06/19 1022)     LOS: 2 days    Time spent: 25 minutes    Barb Merino, MD [facility] Pager [phone]

## 2019-10-06 NOTE — Plan of Care (Signed)
  Problem: Education: Goal: Knowledge of General Education information will improve Description: Including pain rating scale, medication(s)/side effects and non-pharmacologic comfort measures Outcome: Progressing   Problem: Health Behavior/Discharge Planning: Goal: Ability to manage health-related needs will improve Outcome: Progressing   Problem: Clinical Measurements: Goal: Ability to maintain clinical measurements within normal limits will improve Outcome: Progressing Goal: Will remain free from infection Outcome: Progressing Goal: Diagnostic test results will improve Outcome: Progressing   Problem: Activity: Goal: Risk for activity intolerance will decrease Outcome: Progressing   Problem: Nutrition: Goal: Adequate nutrition will be maintained Outcome: Progressing   Problem: Pain Managment: Goal: General experience of comfort will improve Outcome: Progressing   Problem: Safety: Goal: Ability to remain free from injury will improve Outcome: Progressing   Problem: Skin Integrity: Goal: Risk for impaired skin integrity will decrease Outcome: Progressing

## 2019-10-07 MED ORDER — ONDANSETRON HCL 4 MG PO TABS: 4.0000 mg | ORAL_TABLET | Freq: Four times a day (QID) | ORAL | 0 refills | Status: DC | PRN

## 2019-10-07 MED ORDER — ACETAMINOPHEN-CODEINE #3 300-30 MG PO TABS: 1.0000 | ORAL_TABLET | Freq: Four times a day (QID) | ORAL | 0 refills | Status: AC | PRN

## 2019-10-07 NOTE — Progress Notes (Signed)
Discharge  Pt was able to participate with d/c teaching. PIV removed after giving pt pain and nausea medication. Pt able to dress self. Tele removed and returned to storage site. Pt has pain 7/10 at time of d/c. Pt informed of medication changes and where to pick meds up at d/c. Pt has no questions at this time, and will call when ready for wheelchair.

## 2019-10-07 NOTE — Plan of Care (Signed)

## 2019-10-07 NOTE — Discharge Summary (Signed)
Physician Discharge Summary  Jennifer Trevino [id number] DOB: Sep 11, 1950 DOA: 10/03/2019  PCP: Charolette Forward, MD  Admit date: 10/03/2019 Discharge date: 10/07/2019  Admitted From: Home Disposition: Home  Recommendations for Outpatient Follow-up:  1. Follow up with PCP in 1-2 weeks    Discharge Condition: Stable CODE STATUS: Full code Diet recommendation: Low-salt low-carb diet, liquid and easily digestible food.  Discharge summary: 69 year old female with history of hypertension who presented to the emergency room with nausea and vomiting of 1 day.  Patient apparently had multiple injuries recently and was taking hydrocodone for pain relief that she ran out 2 days ago.  In the emergency room, she was tachycardic and hypotensive with low-grade fever.  Abdomen was benign.  Urinalysis consistent with UTI.  Creatinine 1.25.  WBC 12.4.  COVID-19 negative.  Due to significant symptoms admitted hospital.  Patient had persistent symptoms so she was admitted to the hospital treated with IV fluids, nausea medications.  Initially treated for UTI with IV Rocephin. She was treated with Rocephin for 3 days, urine cultures were positive for lactobacillus, no further treatment necessary.  She does have recurrent urinary symptoms mostly after her hysterectomy, there was no post void residual, advised double voiding to empty her bladder.  Because of persistent nausea unknown.  No evidence of opiate withdrawal.  Symptomatically treated.  Bowel functions were normal.  A CT scan abdomen pelvis with contrast was done that was normal.  Her hemoglobin A1c 6.4, may have prediabetes, however not enough to cause gastroparesis.  Patient may have some motility disorder. Today with adequate improvement and able to eat liquid diet.  Nausea medicine were prescribed to use as needed.  Current ongoing use of opiate.  Patient has significant musculoskeletal pain.  She is trying to titrate off the opiates.  Prescribed a short  course of Tylenol 3 with Tylenol and codeine.  Advised different nonopiate medications.  Discharge Diagnoses:  Principal Problem:   Nausea & vomiting Active Problems:   ARF (acute renal failure) [facility])    Discharge Instructions  Discharge Instructions    Diet - low sodium heart healthy   Complete by: As directed    Eat liquid and soft easily digestible food.   Increase activity slowly   Complete by: As directed      Allergies as of 10/07/2019      Reactions   Bactrim Rash      Medication List    STOP taking these medications   HYDROcodone-acetaminophen 5-325 MG tablet Commonly known as: Norco     TAKE these medications   acetaminophen-codeine 300-30 MG tablet Commonly known as: TYLENOL #3 Take 1 tablet by mouth every 6 (six) hours as needed for up to 5 days for moderate pain.   amLODipine-valsartan 5-320 MG tablet Commonly known as: EXFORGE Take 1 tablet by mouth daily.   ondansetron 4 MG tablet Commonly known as: ZOFRAN Take 1 tablet (4 mg total) by mouth every 6 (six) hours as needed for nausea.   zolpidem 10 MG tablet Commonly known as: AMBIEN Take 10 mg by mouth at bedtime as needed.       Allergies  Allergen Reactions  . Bactrim Rash      Procedures/Studies: CT ABDOMEN PELVIS W CONTRAST  Result Date: 10/06/2019 CLINICAL DATA:  Nausea and vomiting. EXAM: CT ABDOMEN AND PELVIS WITH CONTRAST TECHNIQUE: Multidetector CT imaging of the abdomen and pelvis was performed using the standard protocol following bolus administration of intravenous contrast. CONTRAST:  135mL OMNIPAQUE IOHEXOL 300 MG/ML  SOLN COMPARISON:  None. FINDINGS: Lower chest: The lung bases are clear of acute process. No pleural effusion or pulmonary lesions. The heart is normal in size. No pericardial effusion. The distal esophagus and aorta are unremarkable. Hepatobiliary: No focal hepatic lesions other than a benign left hepatic lobe cyst. No intrahepatic biliary dilatation. The gallbladder  is normal. No common bile duct dilatation. Pancreas: No mass, inflammation or ductal dilatation. Spleen: Normal size. No focal lesions. Adrenals/Urinary Tract: The adrenal glands and kidneys are unremarkable. A left renal cyst is noted. Small lower pole right renal calculus but no obstructing ureteral calculi or bladder calculi. No worrisome renal or bladder lesions. Stomach/Bowel: The stomach, duodenum, small bowel and colon are unremarkable. No acute inflammatory changes, mass lesions or obstructive findings. The terminal ileum is normal. The appendix is normal. Descending and sigmoid colon diverticulosis but no findings for acute diverticulitis. Vascular/Lymphatic: Moderate atherosclerotic calcifications involving the aorta and iliac arteries. The branch vessels are patent. No dissection. The major venous structures are patent. No mesenteric or retroperitoneal mass or adenopathy. Reproductive: Surgically absent. Other: No pelvic mass or adenopathy. No free pelvic fluid collections. No inguinal mass or adenopathy. No abdominal wall hernia or subcutaneous lesions. Musculoskeletal: No significant bony findings. IMPRESSION: 1. No acute abdominal/pelvic findings, mass lesions or lymphadenopathy. 2. Small lower pole right renal calculus but no obstructing ureteral calculi or bladder calculi. 3. Descending and sigmoid colon diverticulosis without findings for acute diverticulitis. Aortic Atherosclerosis (ICD10-I70.0). Electronically Signed   By: Marijo Sanes M.D.   On: 10/06/2019 12:40   Subjective: Patient was seen and examined.  No overnight events.  She was able to eat some soft food with breakfast with no trouble.   Discharge Exam: Vitals:   10/06/19 2136 10/07/19 0558  BP:  110/75  Pulse:  84  Resp:  18  Temp: 99.6 F (37.6 C) 98.6 F (37 C)  SpO2:  98%   Vitals:   10/06/19 1357 10/06/19 2048 10/06/19 2136 10/07/19 0558  BP: (!) 151/93 (!) 144/92  110/75  Pulse: 91 (!) 101  84  Resp: 18 16  18    Temp: 98.6 F (37 C) 100.2 F (37.9 C) 99.6 F (37.6 C) 98.6 F (37 C)  TempSrc: Oral Oral Oral Oral  SpO2: 100% 99%  98%  Weight:      Height:        General: Pt is alert, awake, not in acute distress Cardiovascular: RRR, S1/S2 +, no rubs, no gallops Respiratory: CTA bilaterally, no wheezing, no rhonchi Abdominal: Soft, NT, ND, bowel sounds + Extremities: no edema, no cyanosis    The results of significant diagnostics from this hospitalization (including imaging, microbiology, ancillary and laboratory) are listed below for reference.     Microbiology: Recent Results (from the past 240 hour(s))  SARS CORONAVIRUS 2 (TAT 6-24 HRS) Nasopharyngeal Nasopharyngeal Swab     Status: None   Collection Time: 10/04/19  1:05 AM   Specimen: Nasopharyngeal Swab  Result Value Ref Range Status   SARS Coronavirus 2 NEGATIVE NEGATIVE Final    Comment: (NOTE) SARS-CoV-2 target nucleic acids are NOT DETECTED. The SARS-CoV-2 RNA is generally detectable in upper and lower respiratory specimens during the acute phase of infection. Negative results do not preclude SARS-CoV-2 infection, do not rule out co-infections with other pathogens, and should not be used as the sole basis for treatment or other patient management decisions. Negative results must be combined with clinical observations, patient history, and epidemiological information. The expected result is Negative.  Fact Sheet for Patients: SugarRoll.be Fact Sheet for Healthcare Providers: https://www.woods-mathews.com/ This test is not yet approved or cleared by the [place] FDA and  has been authorized for detection and/or diagnosis of SARS-CoV-2 by FDA under an Emergency Use Authorization (EUA). This EUA will remain  in effect (meaning this test can be used) for the duration of the COVID-19 declaration under Section 56 4(b)(1) of the Act, 21 U.S.C. section 360bbb-3(b)(1), unless the  authorization is terminated or revoked sooner. Performed at [facility], [place] [address] [place], [place] [address]   Culture, Urine     Status: Abnormal   Collection Time: 10/04/19  2:15 AM   Specimen: Urine, Random  Result Value Ref Range Status   Specimen Description URINE, RANDOM  Final   Special Requests NONE  Final   Culture (A)  Final    >=100,000 COLONIES/mL LACTOBACILLUS SPECIES Standardized susceptibility testing for this organism is not available. Performed at [facility], [place] [address], [place], [place] [address]    Report Status 10/04/2019 FINAL  Final  Culture, blood (routine x 2)     Status: None (Preliminary result)   Collection Time: 10/04/19  6:32 AM   Specimen: BLOOD RIGHT HAND  Result Value Ref Range Status   Specimen Description BLOOD RIGHT HAND  Final   Special Requests   Final    BOTTLES DRAWN AEROBIC ONLY Blood Culture results may not be optimal due to an inadequate volume of blood received in culture bottles   Culture   Final    NO GROWTH 3 DAYS Performed at [facility], [place] [address], [place], [place] [address]    Report Status PENDING  Incomplete  Culture, blood (routine x 2)     Status: None (Preliminary result)   Collection Time: 10/04/19  6:39 AM   Specimen: BLOOD LEFT HAND  Result Value Ref Range Status   Specimen Description BLOOD LEFT HAND  Final   Special Requests   Final    BOTTLES DRAWN AEROBIC AND ANAEROBIC Blood Culture results may not be optimal due to an inadequate volume of blood received in culture bottles   Culture   Final    NO GROWTH 3 DAYS Performed at [facility], [place] [address] [place],  [address]    Report Status PENDING  Incomplete     Labs: BNP (last 3 results) No results for input(s): BNP in the last 8760 hours. Basic Metabolic Panel: Recent Labs  Lab 10/03/19 1542 10/04/19 0614 10/05/19 0123 10/06/19 0205  NA 140 142 139 138  K 4.4 3.7 3.2* 4.0  CL 105 107 106 103  CO2  17* 20* 20* 22  GLUCOSE 188* 158* 140* 138*  BUN 17 20 12 11   CREATININE 1.25* 1.25* 0.86 0.89  CALCIUM 9.7 9.3 8.8* 9.2  MG  --   --   --  2.3  PHOS  --   --   --  2.5   Liver Function Tests: Recent Labs  Lab 10/03/19 1542 10/04/19 0614  AST 39 20  ALT 12 23  ALKPHOS 72 70  BILITOT 1.5* 0.6  PROT 8.4* 8.5*  ALBUMIN 4.7 4.3   Recent Labs  Lab 10/03/19 1542  LIPASE 17   No results for input(s): AMMONIA in the last 168 hours. CBC: Recent Labs  Lab 10/03/19 1542 10/04/19 0614 10/05/19 0123 10/06/19 0205  WBC 12.4* 14.5* 9.5 10.6*  NEUTROABS  --  11.3*  --  6.1  HGB 14.2 13.8 13.3 13.6  HCT 46.1* 43.0 41.8 42.5  MCV 86.0 83.5 83.4 83.2  PLT 335 319 295 282   Cardiac Enzymes: No results for input(s): CKTOTAL, CKMB, CKMBINDEX, TROPONINI in the last 168 hours. BNP: Invalid input(s): POCBNP CBG: No results for input(s): GLUCAP in the last 168 hours. D-Dimer No results for input(s): DDIMER in the last 72 hours. Hgb A1c No results for input(s): HGBA1C in the last 72 hours. Lipid Profile No results for input(s): CHOL, HDL, LDLCALC, TRIG, CHOLHDL, LDLDIRECT in the last 72 hours. Thyroid function studies No results for input(s): TSH, T4TOTAL, T3FREE, THYROIDAB in the last 72 hours.  Invalid input(s): FREET3 Anemia work up No results for input(s): VITAMINB12, FOLATE, FERRITIN, TIBC, IRON, RETICCTPCT in the last 72 hours. Urinalysis    Component Value Date/Time   COLORURINE YELLOW 10/03/2019 1601   APPEARANCEUR HAZY (A) 10/03/2019 1601   LABSPEC 1.025 10/03/2019 1601   PHURINE 5.0 10/03/2019 1601   GLUCOSEU NEGATIVE 10/03/2019 1601   HGBUR NEGATIVE 10/03/2019 1601   BILIRUBINUR NEGATIVE 10/03/2019 1601   KETONESUR 5 (A) 10/03/2019 1601   PROTEINUR 100 (A) 10/03/2019 1601   UROBILINOGEN 0.2 03/16/2018 1001   NITRITE NEGATIVE 10/03/2019 1601   LEUKOCYTESUR SMALL (A) 10/03/2019 1601   Sepsis Labs Invalid input(s): PROCALCITONIN,  WBC,   LACTICIDVEN Microbiology Recent Results (from the past 240 hour(s))  SARS CORONAVIRUS 2 (TAT 6-24 HRS) Nasopharyngeal Nasopharyngeal Swab     Status: None   Collection Time: 10/04/19  1:05 AM   Specimen: Nasopharyngeal Swab  Result Value Ref Range Status   SARS Coronavirus 2 NEGATIVE NEGATIVE Final    Comment: (NOTE) SARS-CoV-2 target nucleic acids are NOT DETECTED. The SARS-CoV-2 RNA is generally detectable in upper and lower respiratory specimens during the acute phase of infection. Negative results do not preclude SARS-CoV-2 infection, do not rule out co-infections with other pathogens, and should not be used as the sole basis for treatment or other patient management decisions. Negative results must be combined with clinical observations, patient history, and epidemiological information. The expected result is Negative. Fact Sheet for Patients: SugarRoll.be Fact Sheet for Healthcare Providers: https://www.woods-mathews.com/ This test is not yet approved or cleared by the [place] FDA and  has been authorized for detection and/or diagnosis of SARS-CoV-2 by FDA under an Emergency Use Authorization (EUA). This EUA will remain  in effect (meaning this test can be used) for the duration of the COVID-19 declaration under Section 56 4(b)(1) of the Act, 21 U.S.C. section 360bbb-3(b)(1), unless the authorization is terminated or revoked sooner. Performed at [facility], [place] [address], [place], [place] [address]   Culture, Urine     Status: Abnormal   Collection Time: 10/04/19  2:15 AM   Specimen: Urine, Random  Result Value Ref Range Status   Specimen Description URINE, RANDOM  Final   Special Requests NONE  Final   Culture (A)  Final    >=100,000 COLONIES/mL LACTOBACILLUS SPECIES Standardized susceptibility testing for this organism is not available. Performed at [facility], [place] [address], [place],  [address]     Report Status 10/04/2019 FINAL  Final  Culture, blood (routine x 2)     Status: None (Preliminary result)   Collection Time: 10/04/19  6:32 AM   Specimen: BLOOD RIGHT HAND  Result Value Ref Range Status   Specimen Description BLOOD RIGHT HAND  Final   Special Requests   Final    BOTTLES DRAWN AEROBIC ONLY Blood  Culture results may not be optimal due to an inadequate volume of blood received in culture bottles   Culture   Final    NO GROWTH 3 DAYS Performed at [facility], [place] [address] [place], [place] [address]    Report Status PENDING  Incomplete  Culture, blood (routine x 2)     Status: None (Preliminary result)   Collection Time: 10/04/19  6:39 AM   Specimen: BLOOD LEFT HAND  Result Value Ref Range Status   Specimen Description BLOOD LEFT HAND  Final   Special Requests   Final    BOTTLES DRAWN AEROBIC AND ANAEROBIC Blood Culture results may not be optimal due to an inadequate volume of blood received in culture bottles   Culture   Final    NO GROWTH 3 DAYS Performed at [facility], [place] [address], [place], [place] [address]    Report Status PENDING  Incomplete     Time coordinating discharge: 35 minutes  SIGNED:   Barb Merino, MD  [facility] 10/07/2019, 2:33 PM

## 2019-10-09 LAB — CULTURE, BLOOD (ROUTINE X 2)
Culture: NO GROWTH
Culture: NO GROWTH

## 2020-07-17 DIAGNOSIS — I10 Essential (primary) hypertension: Secondary | ICD-10-CM | POA: Diagnosis not present

## 2020-07-17 DIAGNOSIS — R7303 Prediabetes: Secondary | ICD-10-CM | POA: Diagnosis not present

## 2020-07-17 DIAGNOSIS — E785 Hyperlipidemia, unspecified: Secondary | ICD-10-CM | POA: Diagnosis not present

## 2020-08-08 DIAGNOSIS — J019 Acute sinusitis, unspecified: Secondary | ICD-10-CM | POA: Diagnosis not present

## 2020-09-01 DIAGNOSIS — Z20822 Contact with and (suspected) exposure to covid-19: Secondary | ICD-10-CM | POA: Diagnosis not present

## 2020-09-14 DIAGNOSIS — Z20822 Contact with and (suspected) exposure to covid-19: Secondary | ICD-10-CM | POA: Diagnosis not present

## 2020-09-15 DIAGNOSIS — R109 Unspecified abdominal pain: Secondary | ICD-10-CM | POA: Diagnosis not present

## 2020-09-15 DIAGNOSIS — R1033 Periumbilical pain: Secondary | ICD-10-CM | POA: Diagnosis not present

## 2020-09-15 DIAGNOSIS — R309 Painful micturition, unspecified: Secondary | ICD-10-CM | POA: Diagnosis not present

## 2020-09-15 DIAGNOSIS — N200 Calculus of kidney: Secondary | ICD-10-CM | POA: Diagnosis not present

## 2020-09-15 DIAGNOSIS — K5732 Diverticulitis of large intestine without perforation or abscess without bleeding: Secondary | ICD-10-CM | POA: Diagnosis not present

## 2020-10-02 DIAGNOSIS — Z1211 Encounter for screening for malignant neoplasm of colon: Secondary | ICD-10-CM | POA: Diagnosis not present

## 2020-11-02 DIAGNOSIS — H348321 Tributary (branch) retinal vein occlusion, left eye, with retinal neovascularization: Secondary | ICD-10-CM | POA: Diagnosis not present

## 2020-11-02 DIAGNOSIS — I10 Essential (primary) hypertension: Secondary | ICD-10-CM | POA: Diagnosis not present

## 2020-11-02 DIAGNOSIS — H538 Other visual disturbances: Secondary | ICD-10-CM | POA: Diagnosis not present

## 2020-11-02 DIAGNOSIS — R42 Dizziness and giddiness: Secondary | ICD-10-CM | POA: Diagnosis not present

## 2020-11-02 DIAGNOSIS — I668 Occlusion and stenosis of other cerebral arteries: Secondary | ICD-10-CM | POA: Diagnosis not present

## 2020-11-30 DIAGNOSIS — H34832 Tributary (branch) retinal vein occlusion, left eye, with macular edema: Secondary | ICD-10-CM | POA: Diagnosis not present

## 2020-12-10 DIAGNOSIS — M1990 Unspecified osteoarthritis, unspecified site: Secondary | ICD-10-CM | POA: Diagnosis not present

## 2020-12-10 DIAGNOSIS — W2209XA Striking against other stationary object, initial encounter: Secondary | ICD-10-CM | POA: Diagnosis not present

## 2020-12-10 DIAGNOSIS — Y998 Other external cause status: Secondary | ICD-10-CM | POA: Diagnosis not present

## 2020-12-10 DIAGNOSIS — S60221A Contusion of right hand, initial encounter: Secondary | ICD-10-CM | POA: Diagnosis not present

## 2020-12-10 DIAGNOSIS — M19041 Primary osteoarthritis, right hand: Secondary | ICD-10-CM | POA: Diagnosis not present

## 2021-01-11 DIAGNOSIS — Z03818 Encounter for observation for suspected exposure to other biological agents ruled out: Secondary | ICD-10-CM | POA: Diagnosis not present

## 2021-01-31 DIAGNOSIS — N3090 Cystitis, unspecified without hematuria: Secondary | ICD-10-CM | POA: Diagnosis not present

## 2021-01-31 DIAGNOSIS — I10 Essential (primary) hypertension: Secondary | ICD-10-CM | POA: Diagnosis not present

## 2021-02-21 DIAGNOSIS — Z03818 Encounter for observation for suspected exposure to other biological agents ruled out: Secondary | ICD-10-CM | POA: Diagnosis not present

## 2021-03-12 DIAGNOSIS — Z03818 Encounter for observation for suspected exposure to other biological agents ruled out: Secondary | ICD-10-CM | POA: Diagnosis not present

## 2021-03-19 DIAGNOSIS — Z03818 Encounter for observation for suspected exposure to other biological agents ruled out: Secondary | ICD-10-CM | POA: Diagnosis not present

## 2021-04-01 DIAGNOSIS — Z03818 Encounter for observation for suspected exposure to other biological agents ruled out: Secondary | ICD-10-CM | POA: Diagnosis not present

## 2021-04-08 DIAGNOSIS — Z03818 Encounter for observation for suspected exposure to other biological agents ruled out: Secondary | ICD-10-CM | POA: Diagnosis not present

## 2021-04-20 DIAGNOSIS — R0981 Nasal congestion: Secondary | ICD-10-CM | POA: Diagnosis not present

## 2021-04-20 DIAGNOSIS — J45909 Unspecified asthma, uncomplicated: Secondary | ICD-10-CM | POA: Diagnosis not present

## 2021-04-20 DIAGNOSIS — R0602 Shortness of breath: Secondary | ICD-10-CM | POA: Diagnosis not present

## 2021-04-20 DIAGNOSIS — Z20822 Contact with and (suspected) exposure to covid-19: Secondary | ICD-10-CM | POA: Diagnosis not present

## 2021-04-30 DIAGNOSIS — Z03818 Encounter for observation for suspected exposure to other biological agents ruled out: Secondary | ICD-10-CM | POA: Diagnosis not present

## 2021-05-08 DIAGNOSIS — R7303 Prediabetes: Secondary | ICD-10-CM | POA: Diagnosis not present

## 2021-05-08 DIAGNOSIS — E785 Hyperlipidemia, unspecified: Secondary | ICD-10-CM | POA: Diagnosis not present

## 2021-05-08 DIAGNOSIS — I10 Essential (primary) hypertension: Secondary | ICD-10-CM | POA: Diagnosis not present

## 2021-05-13 DIAGNOSIS — Z03818 Encounter for observation for suspected exposure to other biological agents ruled out: Secondary | ICD-10-CM | POA: Diagnosis not present

## 2021-05-30 DIAGNOSIS — Z03818 Encounter for observation for suspected exposure to other biological agents ruled out: Secondary | ICD-10-CM | POA: Diagnosis not present

## 2021-06-15 DIAGNOSIS — Z03818 Encounter for observation for suspected exposure to other biological agents ruled out: Secondary | ICD-10-CM | POA: Diagnosis not present

## 2021-07-11 DIAGNOSIS — Z03818 Encounter for observation for suspected exposure to other biological agents ruled out: Secondary | ICD-10-CM | POA: Diagnosis not present

## 2021-07-24 DIAGNOSIS — I83893 Varicose veins of bilateral lower extremities with other complications: Secondary | ICD-10-CM | POA: Diagnosis not present

## 2021-09-13 DIAGNOSIS — Z03818 Encounter for observation for suspected exposure to other biological agents ruled out: Secondary | ICD-10-CM | POA: Diagnosis not present

## 2021-10-15 DIAGNOSIS — Z03818 Encounter for observation for suspected exposure to other biological agents ruled out: Secondary | ICD-10-CM | POA: Diagnosis not present

## 2021-10-24 DIAGNOSIS — I10 Essential (primary) hypertension: Secondary | ICD-10-CM | POA: Diagnosis not present

## 2021-10-24 DIAGNOSIS — E785 Hyperlipidemia, unspecified: Secondary | ICD-10-CM | POA: Diagnosis not present

## 2021-10-24 DIAGNOSIS — R7303 Prediabetes: Secondary | ICD-10-CM | POA: Diagnosis not present

## 2021-10-25 ENCOUNTER — Emergency Department (HOSPITAL_COMMUNITY): Payer: Medicare Other

## 2021-10-25 ENCOUNTER — Other Ambulatory Visit: Payer: Self-pay

## 2021-10-25 ENCOUNTER — Emergency Department (HOSPITAL_COMMUNITY)
Admission: EM | Admit: 2021-10-25 | Discharge: 2021-10-25 | Disposition: A | Discharge status: Home / Self Care | Payer: Medicare Other | Attending: Emergency Medicine | Admitting: Emergency Medicine

## 2021-10-25 DIAGNOSIS — M67431 Ganglion, right wrist: Secondary | ICD-10-CM | POA: Diagnosis not present

## 2021-10-25 DIAGNOSIS — M25531 Pain in right wrist: Secondary | ICD-10-CM | POA: Diagnosis not present

## 2021-10-25 NOTE — ED Triage Notes (Signed)
Patient c/o wrist pain. Per report pt notice a bump on her right wrist and painful to touch. No accident or injury reported.

## 2021-10-25 NOTE — ED Provider Triage Note (Signed)
Emergency Medicine Provider Triage Evaluation Note  Jennifer Trevino , a 71 y.o. female  was evaluated in triage.  Pt complains of right wrist pain onset yesterday.  Patient states she was tracing pictures with her granddaughter and had onset of this pain.  Denies injury or trauma to this area.  States since then she has noticed a bump on the radial side of the wrist.  Denies fever, chills.  Painful with range of motion.  Review of Systems  Positive: As above Negative: As above  Physical Exam  BP 134/85 (BP Location: Right Arm)    Pulse 91    Temp 98 F (36.7 C) (Oral)    Resp 16    SpO2 99%  Gen:   Awake, no distress   Resp:  Normal effort  MSK:   Moves extremities without difficulty, pain with range of motion, and palpation.  2+ radial pulse present. Other:    Medical Decision Making  Medically screening exam initiated at 3:39 PM.  Appropriate orders placed.  Camera Krienke was informed that the remainder of the evaluation will be completed by another provider, this initial triage assessment does not replace that evaluation, and the importance of remaining in the ED until their evaluation is complete.     Evlyn Courier, PA-C 10/25/21 1540

## 2021-10-25 NOTE — ED Provider Notes (Signed)
[facility] DEPT Provider Note   CSN: [id number] Arrival date & time: 10/25/21  1404     History  Chief Complaint  Patient presents with   Wrist Pain    Jennifer Trevino is a 71 y.o. female.   Wrist Pain Patient presents with right wrist pain.  States bumps appeared on the middle aspect of her right wrist.  States she was tracing with a grandchild and felt a bump come up on her wrist.  Painful.  No other injury.  No fevers.  Has not noticed it before.  Worse with movement of the wrist.     Home Medications Prior to Admission medications   Medication Sig Start Date End Date Taking? Authorizing Provider  amLODipine-valsartan (EXFORGE) 5-320 MG per tablet Take 1 tablet by mouth daily.    [provider]  ondansetron (ZOFRAN) 4 MG tablet Take 1 tablet (4 mg total) by mouth every 6 (six) hours as needed for nausea. 10/07/19   Barb Merino, MD  zolpidem (AMBIEN) 10 MG tablet Take 10 mg by mouth at bedtime as needed.    [provider]      Allergies    Bactrim    Review of Systems   Review of Systems  Constitutional:  Negative for fatigue and fever.  Neurological:  Negative for weakness and numbness.   Physical Exam Updated Vital Signs BP 134/85 (BP Location: Right Arm)    Pulse 91    Temp 98 F (36.7 C) (Oral)    Resp 16    SpO2 99%  Physical Exam Vitals and nursing note reviewed.  Musculoskeletal:     Comments: 3 small bumps beneath the skin on the right distal radius.  Tender.  Worse with movement at the wrist but only pain at the site of the bumps.  No erythema.  Neurovascular intact in the hand.  Neurological:     Mental Status: She is alert.    ED Results / Procedures / Treatments   Labs (all labs ordered are listed, but only abnormal results are displayed) Labs Reviewed - No data to display  EKG None  Radiology DG Wrist Complete Right  Result Date: 10/25/2021 CLINICAL DATA:  Swelling and pain EXAM: RIGHT WRIST  - COMPLETE 3+ VIEW COMPARISON:  None. FINDINGS: No fracture or malalignment. Mild degenerative changes at the first [facility] H joint and STT interval. Soft tissue protuberance adjacent to the distal radius. IMPRESSION: 1. No acute osseous abnormality 2. Soft tissue swelling or small soft tissue mass adjacent to the distal radius Electronically Signed   By: Donavan Foil M.D.   On: 10/25/2021 15:59    Procedures Procedures    Medications Ordered in ED Medications - No data to display  ED Course/ Medical Decision Making/ A&P                           Medical Decision Making Problems Addressed: Ganglion of right wrist: self-limited or minor problem   Patient with pain and swollen bumps on the right wrist.  Tender.  No erythema.  Potentially ganglion cyst.  X-ray independently interpreted and does show only some soft tissue swelling.  Neurovascular intact.  Supportive treatment with over-the-counter pain medicines.  Follow-up with hand surgery.  Discharge home.        Final Clinical Impression(s) / ED Diagnoses Final diagnoses:  Ganglion of right wrist    Rx / DC Orders ED Discharge Orders     None  Davonna Belling, MD 10/25/21 1705

## 2021-11-04 DIAGNOSIS — J329 Chronic sinusitis, unspecified: Secondary | ICD-10-CM | POA: Diagnosis not present

## 2021-11-04 DIAGNOSIS — J4531 Mild persistent asthma with (acute) exacerbation: Secondary | ICD-10-CM | POA: Diagnosis not present

## 2021-11-04 DIAGNOSIS — Z049 Encounter for examination and observation for unspecified reason: Secondary | ICD-10-CM | POA: Diagnosis not present

## 2021-11-07 DIAGNOSIS — Z03818 Encounter for observation for suspected exposure to other biological agents ruled out: Secondary | ICD-10-CM | POA: Diagnosis not present

## 2021-11-22 DIAGNOSIS — E785 Hyperlipidemia, unspecified: Secondary | ICD-10-CM | POA: Diagnosis not present

## 2021-11-22 DIAGNOSIS — I10 Essential (primary) hypertension: Secondary | ICD-10-CM | POA: Diagnosis not present

## 2021-11-22 DIAGNOSIS — J329 Chronic sinusitis, unspecified: Secondary | ICD-10-CM | POA: Diagnosis not present

## 2021-12-04 DIAGNOSIS — J309 Allergic rhinitis, unspecified: Secondary | ICD-10-CM | POA: Diagnosis not present

## 2021-12-04 DIAGNOSIS — H1013 Acute atopic conjunctivitis, bilateral: Secondary | ICD-10-CM | POA: Diagnosis not present

## 2021-12-19 DIAGNOSIS — Z03818 Encounter for observation for suspected exposure to other biological agents ruled out: Secondary | ICD-10-CM | POA: Diagnosis not present

## 2022-01-01 DIAGNOSIS — J45998 Other asthma: Secondary | ICD-10-CM | POA: Diagnosis not present

## 2022-01-01 DIAGNOSIS — I10 Essential (primary) hypertension: Secondary | ICD-10-CM | POA: Diagnosis not present

## 2022-01-01 DIAGNOSIS — N390 Urinary tract infection, site not specified: Secondary | ICD-10-CM | POA: Diagnosis not present

## 2022-01-01 DIAGNOSIS — N12 Tubulo-interstitial nephritis, not specified as acute or chronic: Secondary | ICD-10-CM | POA: Diagnosis not present

## 2022-01-02 DIAGNOSIS — J4531 Mild persistent asthma with (acute) exacerbation: Secondary | ICD-10-CM | POA: Diagnosis not present

## 2022-01-02 DIAGNOSIS — I10 Essential (primary) hypertension: Secondary | ICD-10-CM | POA: Diagnosis not present

## 2022-01-02 DIAGNOSIS — Z0000 Encounter for general adult medical examination without abnormal findings: Secondary | ICD-10-CM | POA: Diagnosis not present

## 2022-01-02 DIAGNOSIS — Z1211 Encounter for screening for malignant neoplasm of colon: Secondary | ICD-10-CM | POA: Diagnosis not present

## 2022-01-29 DIAGNOSIS — I83893 Varicose veins of bilateral lower extremities with other complications: Secondary | ICD-10-CM | POA: Diagnosis not present

## 2022-01-31 DIAGNOSIS — R300 Dysuria: Secondary | ICD-10-CM | POA: Diagnosis not present

## 2022-01-31 DIAGNOSIS — I10 Essential (primary) hypertension: Secondary | ICD-10-CM | POA: Diagnosis not present

## 2022-02-13 DIAGNOSIS — E782 Mixed hyperlipidemia: Secondary | ICD-10-CM | POA: Diagnosis not present

## 2022-02-13 DIAGNOSIS — M25511 Pain in right shoulder: Secondary | ICD-10-CM | POA: Diagnosis not present

## 2022-02-13 DIAGNOSIS — J4531 Mild persistent asthma with (acute) exacerbation: Secondary | ICD-10-CM | POA: Diagnosis not present

## 2022-02-13 DIAGNOSIS — Z0000 Encounter for general adult medical examination without abnormal findings: Secondary | ICD-10-CM | POA: Diagnosis not present

## 2022-02-13 DIAGNOSIS — E119 Type 2 diabetes mellitus without complications: Secondary | ICD-10-CM | POA: Diagnosis not present

## 2022-02-13 DIAGNOSIS — G8929 Other chronic pain: Secondary | ICD-10-CM | POA: Diagnosis not present

## 2022-02-13 DIAGNOSIS — I10 Essential (primary) hypertension: Secondary | ICD-10-CM | POA: Diagnosis not present

## 2022-02-13 DIAGNOSIS — R768 Other specified abnormal immunological findings in serum: Secondary | ICD-10-CM | POA: Diagnosis not present

## 2022-04-14 DIAGNOSIS — E785 Hyperlipidemia, unspecified: Secondary | ICD-10-CM | POA: Diagnosis not present

## 2022-04-14 DIAGNOSIS — I10 Essential (primary) hypertension: Secondary | ICD-10-CM | POA: Diagnosis not present

## 2022-04-14 DIAGNOSIS — E119 Type 2 diabetes mellitus without complications: Secondary | ICD-10-CM | POA: Diagnosis not present

## 2022-04-14 DIAGNOSIS — J45909 Unspecified asthma, uncomplicated: Secondary | ICD-10-CM | POA: Diagnosis not present

## 2022-04-14 DIAGNOSIS — B349 Viral infection, unspecified: Secondary | ICD-10-CM | POA: Diagnosis not present

## 2022-04-17 DIAGNOSIS — Z139 Encounter for screening, unspecified: Secondary | ICD-10-CM | POA: Diagnosis not present

## 2022-04-17 DIAGNOSIS — I10 Essential (primary) hypertension: Secondary | ICD-10-CM | POA: Diagnosis not present

## 2022-04-17 DIAGNOSIS — E119 Type 2 diabetes mellitus without complications: Secondary | ICD-10-CM | POA: Diagnosis not present

## 2022-04-17 DIAGNOSIS — Z5989 Other problems related to housing and economic circumstances: Secondary | ICD-10-CM | POA: Diagnosis not present

## 2022-04-17 DIAGNOSIS — Z0000 Encounter for general adult medical examination without abnormal findings: Secondary | ICD-10-CM | POA: Diagnosis not present

## 2022-04-17 DIAGNOSIS — Z5982 Transportation insecurity: Secondary | ICD-10-CM | POA: Diagnosis not present

## 2022-04-17 DIAGNOSIS — J069 Acute upper respiratory infection, unspecified: Secondary | ICD-10-CM | POA: Diagnosis not present

## 2022-04-17 DIAGNOSIS — J4530 Mild persistent asthma, uncomplicated: Secondary | ICD-10-CM | POA: Diagnosis not present

## 2022-04-17 DIAGNOSIS — E782 Mixed hyperlipidemia: Secondary | ICD-10-CM | POA: Diagnosis not present

## 2022-04-22 DIAGNOSIS — I83893 Varicose veins of bilateral lower extremities with other complications: Secondary | ICD-10-CM | POA: Diagnosis not present

## 2022-05-02 DIAGNOSIS — N3000 Acute cystitis without hematuria: Secondary | ICD-10-CM | POA: Diagnosis not present

## 2022-05-03 DIAGNOSIS — N390 Urinary tract infection, site not specified: Secondary | ICD-10-CM | POA: Diagnosis not present

## 2022-05-13 DIAGNOSIS — I83892 Varicose veins of left lower extremities with other complications: Secondary | ICD-10-CM | POA: Diagnosis not present

## 2022-05-22 DIAGNOSIS — I83893 Varicose veins of bilateral lower extremities with other complications: Secondary | ICD-10-CM | POA: Diagnosis not present

## 2022-06-01 DIAGNOSIS — Z1283 Encounter for screening for malignant neoplasm of skin: Secondary | ICD-10-CM | POA: Diagnosis not present

## 2022-06-01 DIAGNOSIS — L820 Inflamed seborrheic keratosis: Secondary | ICD-10-CM | POA: Diagnosis not present

## 2022-06-01 DIAGNOSIS — B079 Viral wart, unspecified: Secondary | ICD-10-CM | POA: Diagnosis not present

## 2022-06-01 DIAGNOSIS — D4989 Neoplasm of unspecified behavior of other specified sites: Secondary | ICD-10-CM | POA: Diagnosis not present

## 2022-06-01 DIAGNOSIS — D2230 Melanocytic nevi of unspecified part of face: Secondary | ICD-10-CM | POA: Diagnosis not present

## 2022-06-01 DIAGNOSIS — D225 Melanocytic nevi of trunk: Secondary | ICD-10-CM | POA: Diagnosis not present

## 2022-06-03 DIAGNOSIS — I83893 Varicose veins of bilateral lower extremities with other complications: Secondary | ICD-10-CM | POA: Diagnosis not present

## 2022-06-17 DIAGNOSIS — I83893 Varicose veins of bilateral lower extremities with other complications: Secondary | ICD-10-CM | POA: Diagnosis not present

## 2022-06-24 DIAGNOSIS — I83893 Varicose veins of bilateral lower extremities with other complications: Secondary | ICD-10-CM | POA: Diagnosis not present

## 2022-07-01 DIAGNOSIS — I83893 Varicose veins of bilateral lower extremities with other complications: Secondary | ICD-10-CM | POA: Diagnosis not present

## 2022-07-10 DIAGNOSIS — I10 Essential (primary) hypertension: Secondary | ICD-10-CM | POA: Diagnosis not present

## 2022-07-10 DIAGNOSIS — R309 Painful micturition, unspecified: Secondary | ICD-10-CM | POA: Diagnosis not present

## 2022-07-10 DIAGNOSIS — N390 Urinary tract infection, site not specified: Secondary | ICD-10-CM | POA: Diagnosis not present

## 2022-07-22 DIAGNOSIS — I83893 Varicose veins of bilateral lower extremities with other complications: Secondary | ICD-10-CM | POA: Diagnosis not present

## 2022-07-28 DIAGNOSIS — J329 Chronic sinusitis, unspecified: Secondary | ICD-10-CM | POA: Diagnosis not present

## 2022-08-04 DIAGNOSIS — J4531 Mild persistent asthma with (acute) exacerbation: Secondary | ICD-10-CM | POA: Diagnosis not present

## 2022-08-04 DIAGNOSIS — I10 Essential (primary) hypertension: Secondary | ICD-10-CM | POA: Diagnosis not present

## 2022-08-04 DIAGNOSIS — E782 Mixed hyperlipidemia: Secondary | ICD-10-CM | POA: Diagnosis not present

## 2022-08-04 DIAGNOSIS — R1013 Epigastric pain: Secondary | ICD-10-CM | POA: Diagnosis not present

## 2022-08-04 DIAGNOSIS — E119 Type 2 diabetes mellitus without complications: Secondary | ICD-10-CM | POA: Diagnosis not present

## 2022-08-04 DIAGNOSIS — Z0000 Encounter for general adult medical examination without abnormal findings: Secondary | ICD-10-CM | POA: Diagnosis not present

## 2022-08-04 DIAGNOSIS — J329 Chronic sinusitis, unspecified: Secondary | ICD-10-CM | POA: Diagnosis not present

## 2022-08-21 DIAGNOSIS — I83893 Varicose veins of bilateral lower extremities with other complications: Secondary | ICD-10-CM | POA: Diagnosis not present

## 2022-09-16 DIAGNOSIS — I83893 Varicose veins of bilateral lower extremities with other complications: Secondary | ICD-10-CM | POA: Diagnosis not present

## 2022-09-18 DIAGNOSIS — I83892 Varicose veins of left lower extremities with other complications: Secondary | ICD-10-CM | POA: Diagnosis not present

## 2022-09-30 DIAGNOSIS — I83892 Varicose veins of left lower extremities with other complications: Secondary | ICD-10-CM | POA: Diagnosis not present

## 2022-10-13 DIAGNOSIS — Z139 Encounter for screening, unspecified: Secondary | ICD-10-CM | POA: Diagnosis not present

## 2022-10-13 DIAGNOSIS — Z0000 Encounter for general adult medical examination without abnormal findings: Secondary | ICD-10-CM | POA: Diagnosis not present

## 2022-10-13 DIAGNOSIS — E119 Type 2 diabetes mellitus without complications: Secondary | ICD-10-CM | POA: Diagnosis not present

## 2022-10-13 DIAGNOSIS — J329 Chronic sinusitis, unspecified: Secondary | ICD-10-CM | POA: Diagnosis not present

## 2022-10-13 DIAGNOSIS — E782 Mixed hyperlipidemia: Secondary | ICD-10-CM | POA: Diagnosis not present

## 2022-10-13 DIAGNOSIS — Z5989 Other problems related to housing and economic circumstances: Secondary | ICD-10-CM | POA: Diagnosis not present

## 2022-10-13 DIAGNOSIS — Z5982 Transportation insecurity: Secondary | ICD-10-CM | POA: Diagnosis not present

## 2022-10-13 DIAGNOSIS — I10 Essential (primary) hypertension: Secondary | ICD-10-CM | POA: Diagnosis not present

## 2022-10-13 DIAGNOSIS — M25511 Pain in right shoulder: Secondary | ICD-10-CM | POA: Diagnosis not present

## 2022-10-13 DIAGNOSIS — J4531 Mild persistent asthma with (acute) exacerbation: Secondary | ICD-10-CM | POA: Diagnosis not present

## 2022-10-13 DIAGNOSIS — Z23 Encounter for immunization: Secondary | ICD-10-CM | POA: Diagnosis not present

## 2022-10-13 DIAGNOSIS — G8929 Other chronic pain: Secondary | ICD-10-CM | POA: Diagnosis not present

## 2022-10-13 DIAGNOSIS — M2550 Pain in unspecified joint: Secondary | ICD-10-CM | POA: Diagnosis not present

## 2022-10-21 DIAGNOSIS — J3489 Other specified disorders of nose and nasal sinuses: Secondary | ICD-10-CM | POA: Diagnosis not present

## 2022-10-21 DIAGNOSIS — R0981 Nasal congestion: Secondary | ICD-10-CM | POA: Diagnosis not present

## 2022-10-21 DIAGNOSIS — J321 Chronic frontal sinusitis: Secondary | ICD-10-CM | POA: Diagnosis not present

## 2022-10-21 DIAGNOSIS — J342 Deviated nasal septum: Secondary | ICD-10-CM | POA: Diagnosis not present

## 2022-10-23 DIAGNOSIS — R300 Dysuria: Secondary | ICD-10-CM | POA: Diagnosis not present

## 2022-10-23 DIAGNOSIS — N390 Urinary tract infection, site not specified: Secondary | ICD-10-CM | POA: Diagnosis not present

## 2022-10-28 DIAGNOSIS — N390 Urinary tract infection, site not specified: Secondary | ICD-10-CM | POA: Diagnosis not present

## 2022-10-28 DIAGNOSIS — I83893 Varicose veins of bilateral lower extremities with other complications: Secondary | ICD-10-CM | POA: Diagnosis not present

## 2023-06-03 ENCOUNTER — Ambulatory Visit (HOSPITAL_COMMUNITY)
Admission: EM | Admit: 2023-06-03 | Discharge: 2023-06-03 | Disposition: A | Discharge status: Home / Self Care | Payer: Medicare HMO

## 2023-06-03 ENCOUNTER — Encounter (HOSPITAL_COMMUNITY): Payer: Self-pay

## 2023-06-03 DIAGNOSIS — N3000 Acute cystitis without hematuria: Secondary | ICD-10-CM | POA: Diagnosis not present

## 2023-06-03 LAB — POCT URINALYSIS DIP (MANUAL ENTRY)
Bilirubin, UA: NEGATIVE
Blood, UA: NEGATIVE
Glucose, UA: NEGATIVE mg/dL
Ketones, POC UA: NEGATIVE mg/dL
Nitrite, UA: POSITIVE — AB
Protein Ur, POC: NEGATIVE mg/dL
Spec Grav, UA: 1.015 (ref 1.010–1.025)
Urobilinogen, UA: 0.2 U/dL
pH, UA: 7.5 (ref 5.0–8.0)

## 2023-06-03 MED ORDER — NITROFURANTOIN MONOHYD MACRO 100 MG PO CAPS: 100.0000 mg | ORAL_CAPSULE | Freq: Two times a day (BID) | ORAL | 0 refills | Status: AC

## 2023-06-03 MED ORDER — PHENAZOPYRIDINE HCL 95 MG PO TABS: 95.0000 mg | ORAL_TABLET | Freq: Three times a day (TID) | ORAL | 0 refills | Status: AC | PRN

## 2023-06-03 NOTE — Discharge Instructions (Addendum)
Take the Macrobid twice daily with food.  You can also take the Pyridium as needed to help with dysuria.  Ensure you are drinking at least 64 ounces of water daily.  If the Macrobid does not fully resolve your symptoms, or you have any changes, please follow-up with your primary care provider as they may need to refer you to urologist.  Return to clinic for any new or urgent symptoms.

## 2023-06-03 NOTE — ED Provider Notes (Signed)
[facility]    CSN: [id number] Arrival date & time: 06/03/23  0915      History   Chief Complaint Chief Complaint  Patient presents with   Urinary Frequency    HPI Jennifer Trevino is a 72 y.o. female.   Patient presents to clinic with complaints of suprapubic pressure, urinary frequency, dysuria, and urinary odor for the past 5 days.   She reports frequent cystitis.  Had been seen by a urologist in [place] and placed on Macrobid for 90 days, she had been off of those for the past month and a half and had been fine when 5 days ago her symptoms started.  She denies any nausea, vomiting, fevers or flank pain.  She spends half of her time in [place] and half of her time down here in [place].  She has a PCP her in [place].   The history is provided by the patient and medical records.  Urinary Frequency Associated symptoms include abdominal pain.    Past Medical History:  Diagnosis Date   Hepatitis C    Hypertension     Patient Active Problem List   Diagnosis Date Noted   ARF (acute renal failure) (HCC) 10/04/2019   Nausea & vomiting 10/03/2019    Past Surgical History:  Procedure Laterality Date   ABDOMINAL HYSTERECTOMY     BREAST LUMPECTOMY     DILATION AND CURETTAGE OF UTERUS     HERNIA REPAIR     INGUINAL HERNIA REPAIR     UMBILICAL HERNIA REPAIR      OB History   No obstetric history on file.      Home Medications    Prior to Admission medications   Medication Sig Start Date End Date Taking? Authorizing Provider  albuterol (VENTOLIN HFA) 108 (90 Base) MCG/ACT inhaler Inhale 1-2 puffs into the lungs. 01/28/23  Yes [provider]  atorvastatin (LIPITOR) 20 MG tablet Take 20 mg by mouth daily. 05/20/23  Yes [provider]  fluticasone (FLONASE) 50 MCG/ACT nasal spray Place 1 spray into both nostrils 2 (two) times daily. 01/28/23  Yes [provider]  nitrofurantoin, macrocrystal-monohydrate, (MACROBID) 100 MG capsule  Take 1 capsule (100 mg total) by mouth 2 (two) times daily for 7 days. 06/03/23 06/10/23 Yes Rinaldo Ratel, [place], FNP  phenazopyridine (PYRIDIUM) 95 MG tablet Take 1 tablet (95 mg total) by mouth 3 (three) times daily as needed for pain. 06/03/23  Yes Rinaldo Ratel, [place] N, FNP  traMADol (ULTRAM) 50 MG tablet Take 50 mg by mouth 3 (three) times daily as needed. 06/02/23  Yes [provider]  amLODipine-valsartan (EXFORGE) 5-320 MG per tablet Take 1 tablet by mouth daily.    [provider]    Family History Family History  Problem Relation Age of Onset   Diabetes Mother    Healthy Father     Social History Social History   Tobacco Use   Smoking status: Never   Smokeless tobacco: Never  Vaping Use   Vaping status: Never Used  Substance Use Topics   Alcohol use: No   Drug use: No     Allergies   Bactrim   Review of Systems Review of Systems  Constitutional:  Negative for fever.  Gastrointestinal:  Positive for abdominal pain. Negative for nausea and vomiting.  Genitourinary:  Positive for dysuria and frequency. Negative for flank pain and hematuria.     Physical Exam Triage Vital Signs ED Triage Vitals  Encounter Vitals Group  BP 06/03/23 0943 (!) 142/84     Systolic BP Percentile --      Diastolic BP Percentile --      Pulse Rate 06/03/23 0943 79     Resp 06/03/23 0943 16     Temp 06/03/23 0943 98.8 F (37.1 C)     Temp Source 06/03/23 0943 Oral     SpO2 06/03/23 0943 94 %     Weight 06/03/23 0940 181 lb (82.1 kg)     Height 06/03/23 0940 5\' 5"  (1.651 m)     Head Circumference --      Peak Flow --      Pain Score 06/03/23 0939 8     Pain Loc --      Pain Education --      Exclude from Growth Chart --    No data found.  Updated Vital Signs BP (!) 142/84 (BP Location: Left Arm)   Pulse 79   Temp 98.8 F (37.1 C) (Oral)   Resp 16   Ht 5\' 5"  (1.651 m)   Wt 181 lb (82.1 kg)   SpO2 94%   BMI 30.12 kg/m   Visual Acuity Right Eye  Distance:   Left Eye Distance:   Bilateral Distance:    Right Eye Near:   Left Eye Near:    Bilateral Near:     Physical Exam Vitals and nursing note reviewed.  Constitutional:      Appearance: Normal appearance.  HENT:     Head: Normocephalic and atraumatic.     Right Ear: External ear normal.     Left Ear: External ear normal.     Nose: Nose normal.     Mouth/Throat:     Mouth: Mucous membranes are moist.  Eyes:     General: No scleral icterus. Cardiovascular:     Rate and Rhythm: Normal rate.  Pulmonary:     Effort: Pulmonary effort is normal. No respiratory distress.  Abdominal:     Tenderness: There is no right CVA tenderness or left CVA tenderness.  Musculoskeletal:        General: Normal range of motion.  Neurological:     General: No focal deficit present.     Mental Status: She is alert and oriented to person, place, and time.  Psychiatric:        Mood and Affect: Mood normal.        Behavior: Behavior normal. Behavior is cooperative.      UC Treatments / Results  Labs (all labs ordered are listed, but only abnormal results are displayed) Labs Reviewed  POCT URINALYSIS DIP (MANUAL ENTRY) - Abnormal; Notable for the following components:      Result Value   Color, UA light yellow (*)    Nitrite, UA Positive (*)    Leukocytes, UA Small (1+) (*)    All other components within normal limits    EKG   Radiology No results found.  Procedures Procedures (including critical care time)  Medications Ordered in UC Medications - No data to display  Initial Impression / Assessment and Plan / UC Course  I have reviewed the triage vital signs and the nursing notes.  Pertinent labs & imaging results that were available during my care of the patient were reviewed by me and considered in my medical decision making (see chart for details).  Vitals and triage reviewed, patient is hemodynamically stable.  Urinalysis with nitrites and leukocytes.  Will treat with  Macrobid.  Patient does not have  CVA tenderness, fevers, flank pain, nausea or vomiting, low concern for pyelonephritis.  Azo as needed for symptomatic management.  Encourage PCP follow-up for potential urology referral if symptoms persist.  Plan of care, follow-up care return precautions given, no questions at this time.     Final Clinical Impressions(s) / UC Diagnoses   Final diagnoses:  Acute cystitis without hematuria     Discharge Instructions      Take the Macrobid twice daily with food.  You can also take the Pyridium as needed to help with dysuria.  Ensure you are drinking at least 64 ounces of water daily.  If the Macrobid does not fully resolve your symptoms, or you have any changes, please follow-up with your primary care provider as they may need to refer you to urologist.  Return to clinic for any new or urgent symptoms.     ED Prescriptions     Medication Sig Dispense Auth. Provider   nitrofurantoin, macrocrystal-monohydrate, (MACROBID) 100 MG capsule Take 1 capsule (100 mg total) by mouth 2 (two) times daily for 7 days. 14 capsule Rinaldo Ratel, [place] N, [place]   phenazopyridine (PYRIDIUM) 95 MG tablet Take 1 tablet (95 mg total) by mouth 3 (three) times daily as needed for pain. 10 tablet Larisha Vencill, [place] N, FNP      PDMP not reviewed this encounter.   Xavion Muscat, [place] N, [place] 06/03/23 1021

## 2023-06-03 NOTE — ED Triage Notes (Signed)
Patient here today with c/o urinary frequency, urgency, LB pain, and burning in urination X 5 days. Patient states that she had these symptoms about 5 months ago in [place] and was given Nitrofurantoin for 90 days and symptoms went away but have come back.

## 2023-06-11 ENCOUNTER — Emergency Department (HOSPITAL_COMMUNITY)
Admission: EM | Admit: 2023-06-11 | Discharge: 2023-06-11 | Disposition: A | Discharge status: Home / Self Care | Payer: Medicare HMO | Attending: Emergency Medicine | Admitting: Emergency Medicine

## 2023-06-11 ENCOUNTER — Emergency Department (HOSPITAL_COMMUNITY): Payer: Medicare HMO

## 2023-06-11 ENCOUNTER — Other Ambulatory Visit: Payer: Self-pay

## 2023-06-11 ENCOUNTER — Encounter (HOSPITAL_COMMUNITY): Payer: Self-pay

## 2023-06-11 DIAGNOSIS — D72829 Elevated white blood cell count, unspecified: Secondary | ICD-10-CM | POA: Insufficient documentation

## 2023-06-11 DIAGNOSIS — N390 Urinary tract infection, site not specified: Secondary | ICD-10-CM

## 2023-06-11 DIAGNOSIS — R197 Diarrhea, unspecified: Secondary | ICD-10-CM | POA: Insufficient documentation

## 2023-06-11 DIAGNOSIS — R1084 Generalized abdominal pain: Secondary | ICD-10-CM | POA: Insufficient documentation

## 2023-06-11 DIAGNOSIS — R112 Nausea with vomiting, unspecified: Secondary | ICD-10-CM | POA: Diagnosis present

## 2023-06-11 DIAGNOSIS — E860 Dehydration: Secondary | ICD-10-CM

## 2023-06-11 LAB — CBC WITH DIFFERENTIAL/PLATELET
Abs Immature Granulocytes: 0.03 10*3/uL (ref 0.00–0.07)
Basophils Absolute: 0.1 10*3/uL (ref 0.0–0.1)
Basophils Relative: 1 %
Eosinophils Absolute: 0 10*3/uL (ref 0.0–0.5)
Eosinophils Relative: 0 %
HCT: 44.2 % (ref 36.0–46.0)
Hemoglobin: 14 g/dL (ref 12.0–15.0)
Immature Granulocytes: 0 %
Lymphocytes Relative: 26 %
Lymphs Abs: 2.8 10*3/uL (ref 0.7–4.0)
MCH: 26.3 pg (ref 26.0–34.0)
MCHC: 31.7 g/dL (ref 30.0–36.0)
MCV: 82.9 fL (ref 80.0–100.0)
Monocytes Absolute: 0.7 10*3/uL (ref 0.1–1.0)
Monocytes Relative: 6 %
Neutro Abs: 7.2 10*3/uL (ref 1.7–7.7)
Neutrophils Relative %: 67 %
Platelets: 364 10*3/uL (ref 150–400)
RBC: 5.33 MIL/uL — ABNORMAL HIGH (ref 3.87–5.11)
RDW: 14.2 % (ref 11.5–15.5)
WBC: 10.8 10*3/uL — ABNORMAL HIGH (ref 4.0–10.5)
nRBC: 0 % (ref 0.0–0.2)

## 2023-06-11 LAB — COMPREHENSIVE METABOLIC PANEL
ALT: 25 U/L (ref 0–44)
AST: 22 U/L (ref 15–41)
Albumin: 4.1 g/dL (ref 3.5–5.0)
Alkaline Phosphatase: 69 U/L (ref 38–126)
Anion gap: 13 (ref 5–15)
BUN: 15 mg/dL (ref 8–23)
CO2: 22 mmol/L (ref 22–32)
Calcium: 9.1 mg/dL (ref 8.9–10.3)
Chloride: 101 mmol/L (ref 98–111)
Creatinine, Ser: 1.15 mg/dL — ABNORMAL HIGH (ref 0.44–1.00)
GFR, Estimated: 50 mL/min — ABNORMAL LOW (ref >60)
Glucose, Bld: 198 mg/dL — ABNORMAL HIGH (ref 70–99)
Potassium: 3.4 mmol/L — ABNORMAL LOW (ref 3.5–5.1)
Sodium: 136 mmol/L (ref 135–145)
Total Bilirubin: 1 mg/dL (ref 0.3–1.2)
Total Protein: 8.1 g/dL (ref 6.5–8.1)

## 2023-06-11 LAB — LIPASE, BLOOD: Lipase: 25 U/L (ref 11–51)

## 2023-06-11 LAB — URINALYSIS, ROUTINE W REFLEX MICROSCOPIC
Bilirubin Urine: NEGATIVE
Glucose, UA: NEGATIVE mg/dL
Ketones, ur: NEGATIVE mg/dL
Nitrite: NEGATIVE
Protein, ur: 100 mg/dL — AB
Specific Gravity, Urine: 1.018 (ref 1.005–1.030)
pH: 6 (ref 5.0–8.0)

## 2023-06-11 MED ORDER — SODIUM CHLORIDE 0.9 % IV SOLN
1.0000 g | Freq: Once | INTRAVENOUS | Status: AC
  Administered 2023-06-11: 1 g via INTRAVENOUS
  Filled 2023-06-11: qty 10

## 2023-06-11 MED ORDER — ONDANSETRON 4 MG PO TBDP
8.0000 mg | ORAL_TABLET | Freq: Once | ORAL | Status: AC
  Administered 2023-06-11: 8 mg via ORAL
  Filled 2023-06-11: qty 2

## 2023-06-11 MED ORDER — CEFDINIR 300 MG PO CAPS: 300.0000 mg | ORAL_CAPSULE | Freq: Two times a day (BID) | ORAL | 0 refills | Status: AC

## 2023-06-11 MED ORDER — ONDANSETRON HCL 4 MG PO TABS: 4.0000 mg | ORAL_TABLET | Freq: Four times a day (QID) | ORAL | 0 refills | Status: AC | PRN

## 2023-06-11 MED ORDER — IOHEXOL 350 MG/ML SOLN
75.0000 mL | Freq: Once | INTRAVENOUS | Status: AC | PRN
  Administered 2023-06-11: 75 mL via INTRAVENOUS

## 2023-06-11 MED ORDER — SODIUM CHLORIDE 0.9 % IV BOLUS
1000.0000 mL | Freq: Once | INTRAVENOUS | Status: AC
  Administered 2023-06-11: 1000 mL via INTRAVENOUS

## 2023-06-11 MED ORDER — POTASSIUM CHLORIDE CRYS ER 20 MEQ PO TBCR
20.0000 meq | EXTENDED_RELEASE_TABLET | Freq: Once | ORAL | Status: AC
  Administered 2023-06-11: 20 meq via ORAL
  Filled 2023-06-11: qty 1

## 2023-06-11 MED ORDER — ONDANSETRON 4 MG PO TBDP
4.0000 mg | ORAL_TABLET | Freq: Once | ORAL | Status: AC
  Administered 2023-06-11: 4 mg via ORAL
  Filled 2023-06-11: qty 1

## 2023-06-11 NOTE — ED Notes (Signed)
This Rn attempted IV access twice without success

## 2023-06-11 NOTE — ED Provider Triage Note (Signed)
Emergency Medicine Provider Triage Evaluation Note  Jennifer Trevino , a 72 y.o. female  was evaluated in triage.  Pt complains of nausea/vomiting/diarrhea x3 days. Seen 10/2 with dx UTI at [facility] and started on macrobid/azo. UA at that time with nutrrites and leuks, o/w wnl, no CTX avail from that sample.  Minimal p.o. with last 3 days.  Ongoing nausea vomiting diarrhea.  No BRBPR or melena.  Last emesis was around 6- 7 hours ago.  Unable to complete antibiotic secondary to nausea and vomiting.  Generalized abdominal pain, feels weak, dehydrated.  Screening labs, antiemetic, CT abdomen pelvis, IV fluids.  She is tachycardic, appears dry on exam.   Review of Systems  Positive: Abdominal pain, nausea or vomiting Negative: Melena  Physical Exam  BP (!) 158/93   Pulse (!) 128   Temp 98.1 F (36.7 C)   Resp 19   Ht 5\' 5"  (1.651 m)   Wt 82.1 kg   SpO2 100%   BMI 30.12 kg/m  Gen:   Awake, no distress, appears dry Resp:  Normal effort  MSK:   Moves extremities without difficulty  Other:  Generalized abdominal discomfort, nonperitoneal  Medical Decision Making  Medically screening exam initiated at 9:22 AM.  Appropriate orders placed.  Julius Boniface was informed that the remainder of the evaluation will be completed by another provider, this initial triage assessment does not replace that evaluation, and the importance of remaining in the ED until their evaluation is complete.     Sloan Leiter, DO 06/11/23 [phone]

## 2023-06-11 NOTE — ED Triage Notes (Signed)
Pt c/o N/V/Dx3d.

## 2023-06-11 NOTE — ED Notes (Signed)
Patient verbalizes understanding of discharge instructions. Opportunity for questioning and answers were provided. Pt discharged from ED. 

## 2023-06-11 NOTE — Discharge Instructions (Signed)
Ms Jennifer Trevino, you were seen in the ED for nausea and vomiting. Although very unpleasant, your workup does not suggest signs of a serious cause that would require hospitalization. Most likely, you have caught a stomach virus that is causing these symptoms. We will send you home with an anti-nausea medicine called Zofran. We will also start a different antibiotic for your UTI, which still appears to be present. Please call your primary care doctors to make a follow up appointment as soon as possible.

## 2023-06-11 NOTE — ED Provider Notes (Signed)
Jennifer Trevino EMERGENCY DEPARTMENT AT [facility] Provider Note   CSN: [id number] Arrival date & time: 06/11/23  8657     History  Chief Complaint  Patient presents with   Emesis   Diarrhea    Jennifer Trevino is a 72 y.o. female.  Jennifer Trevino is a 72 yo F complaining of 3 days of nonbloody N/V/D. She is finishing treatment for a UTI with macrobid and took 5 of 7 days until her GI illness prevented further doses, and her UTIs are recurrent with a recent cystoscope in [place]. Now, she notes over ten episodes of emesis and diarrhea since Tuesday three days ago. She feels weak as a result and has generalized mild abdominal pain that she attributes to retching. She denies sick contacts or recent travel/new food. No fevers, chills, chest pain. Yes to weakness and dizziness.   Emesis Associated symptoms: abdominal pain and diarrhea   Associated symptoms: no chills, no cough, no fever and no headaches   Diarrhea Associated symptoms: abdominal pain and vomiting   Associated symptoms: no chills, no fever and no headaches        Home Medications Prior to Admission medications   Medication Sig Start Date End Date Taking? Authorizing Provider  albuterol (VENTOLIN HFA) 108 (90 Base) MCG/ACT inhaler Inhale 1-2 puffs into the lungs. 01/28/23   [provider]  amLODipine-valsartan (EXFORGE) 5-320 MG per tablet Take 1 tablet by mouth daily.    [provider]  atorvastatin (LIPITOR) 20 MG tablet Take 20 mg by mouth daily. 05/20/23   [provider]  fluticasone (FLONASE) 50 MCG/ACT nasal spray Place 1 spray into both nostrils 2 (two) times daily. 01/28/23   [provider]  phenazopyridine (PYRIDIUM) 95 MG tablet Take 1 tablet (95 mg total) by mouth 3 (three) times daily as needed for pain. 06/03/23   [place], [place] N, FNP  traMADol (ULTRAM) 50 MG tablet Take 50 mg by mouth 3 (three) times daily as needed. 06/02/23   [provider]       Allergies    Bactrim    Review of Systems   Review of Systems  Constitutional:  Positive for fatigue. Negative for chills and fever.  Respiratory:  Negative for cough and shortness of breath.   Cardiovascular:  Negative for chest pain and palpitations.  Gastrointestinal:  Positive for abdominal pain, diarrhea, nausea and vomiting. Negative for blood in stool.  Genitourinary:  Negative for flank pain.  Neurological:  Positive for dizziness. Negative for light-headedness and headaches.  Psychiatric/Behavioral:  Negative for confusion.     Physical Exam Updated Vital Signs BP (!) 145/101   Pulse (!) 50   Temp 98.4 F (36.9 C) (Oral)   Resp 15   Ht 5\' 5"  (1.651 m)   Wt 82.1 kg   SpO2 97%   BMI 30.12 kg/m  Physical Exam Constitutional:      General: She is not in acute distress.    Appearance: Normal appearance. She is ill-appearing.  HENT:     Head: Normocephalic and atraumatic.  Cardiovascular:     Rate and Rhythm: Normal rate and regular rhythm.     Pulses: Normal pulses.  Pulmonary:     Effort: Pulmonary effort is normal.     Breath sounds: Normal breath sounds.  Abdominal:     General: Abdomen is flat. Bowel sounds are normal. There is no distension.     Palpations: Abdomen is soft.     Tenderness: There is abdominal tenderness.  There is no right CVA tenderness, left CVA tenderness, guarding or rebound.  Musculoskeletal:     Right lower leg: No edema.     Left lower leg: No edema.  Skin:    General: Skin is warm and dry.     Capillary Refill: Capillary refill takes 2 to 3 seconds.  Neurological:     General: No focal deficit present.     Mental Status: She is alert and oriented to person, place, and time.  Psychiatric:        Mood and Affect: Mood normal.        Behavior: Behavior normal.     ED Results / Procedures / Treatments   Labs (all labs ordered are listed, but only abnormal results are displayed) Labs Reviewed  COMPREHENSIVE METABOLIC PANEL -  Abnormal; Notable for the following components:      Result Value   Potassium 3.4 (*)    Glucose, Bld 198 (*)    Creatinine, Ser 1.15 (*)    GFR, Estimated 50 (*)    All other components within normal limits  CBC WITH DIFFERENTIAL/PLATELET - Abnormal; Notable for the following components:   WBC 10.8 (*)    RBC 5.33 (*)    All other components within normal limits  URINALYSIS, ROUTINE W REFLEX MICROSCOPIC - Abnormal; Notable for the following components:   APPearance HAZY (*)    Hgb urine dipstick SMALL (*)    Protein, ur 100 (*)    Leukocytes,Ua TRACE (*)    Bacteria, UA MANY (*)    All other components within normal limits  C DIFFICILE QUICK SCREEN W PCR REFLEX    GASTROINTESTINAL PANEL BY PCR, STOOL (REPLACES STOOL CULTURE)  CULTURE, BLOOD (ROUTINE X 2)  CULTURE, BLOOD (ROUTINE X 2)  LIPASE, BLOOD  I-STAT CG4 LACTIC ACID, ED    EKG None  Radiology No results found.  Procedures Procedures    Medications Ordered in ED Medications  sodium chloride 0.9 % bolus 1,000 mL (has no administration in time range)  cefTRIAXone (ROCEPHIN) 1 g in sodium chloride 0.9 % 100 mL IVPB (has no administration in time range)  ondansetron (ZOFRAN-ODT) disintegrating tablet 4 mg (4 mg Oral Given 06/11/23 1914)    ED Course/ Medical Decision Making/ A&P                                 Medical Decision Making Pt with acute complaint of N/V x 3 days. No sick contacts, no recent travel. Is currently being treated for UTI with macrobid but could not complete course due to GI distress. Vitals are stable. No fever. Very mild leukocytosis. Mild increase in Cr in setting of reduced intake. Lipase WNL. Abdominal scan without acute process, does show diverticulosis. Not currently concerned for obstruction, ischemia, colitis, and so favor viral gastroenteritis as cause of GI distress. UTI is still present. Nausea improved with symptomatic treatment and she did tolerate po challenge. Fluids were  replaced. Will discharge pt with anti-emetics and abx for UTI.  Amount and/or Complexity of Data Reviewed Labs: ordered.  Risk Prescription drug management.          Final Clinical Impression(s) / ED Diagnoses Final diagnoses:  None    Rx / DC Orders ED Discharge Orders     None         Katheran James, DO 06/14/23 1514    Alvira Monday, MD 06/21/23 0001

## 2023-10-06 IMAGING — CR DG WRIST COMPLETE 3+V*R*
4 series · 4 of 4 positions shown · non-contrast
Comparison: None.

CLINICAL DATA: Swelling and pain

EXAM:
RIGHT WRIST - COMPLETE 3+ VIEW

[x wrist pa right]
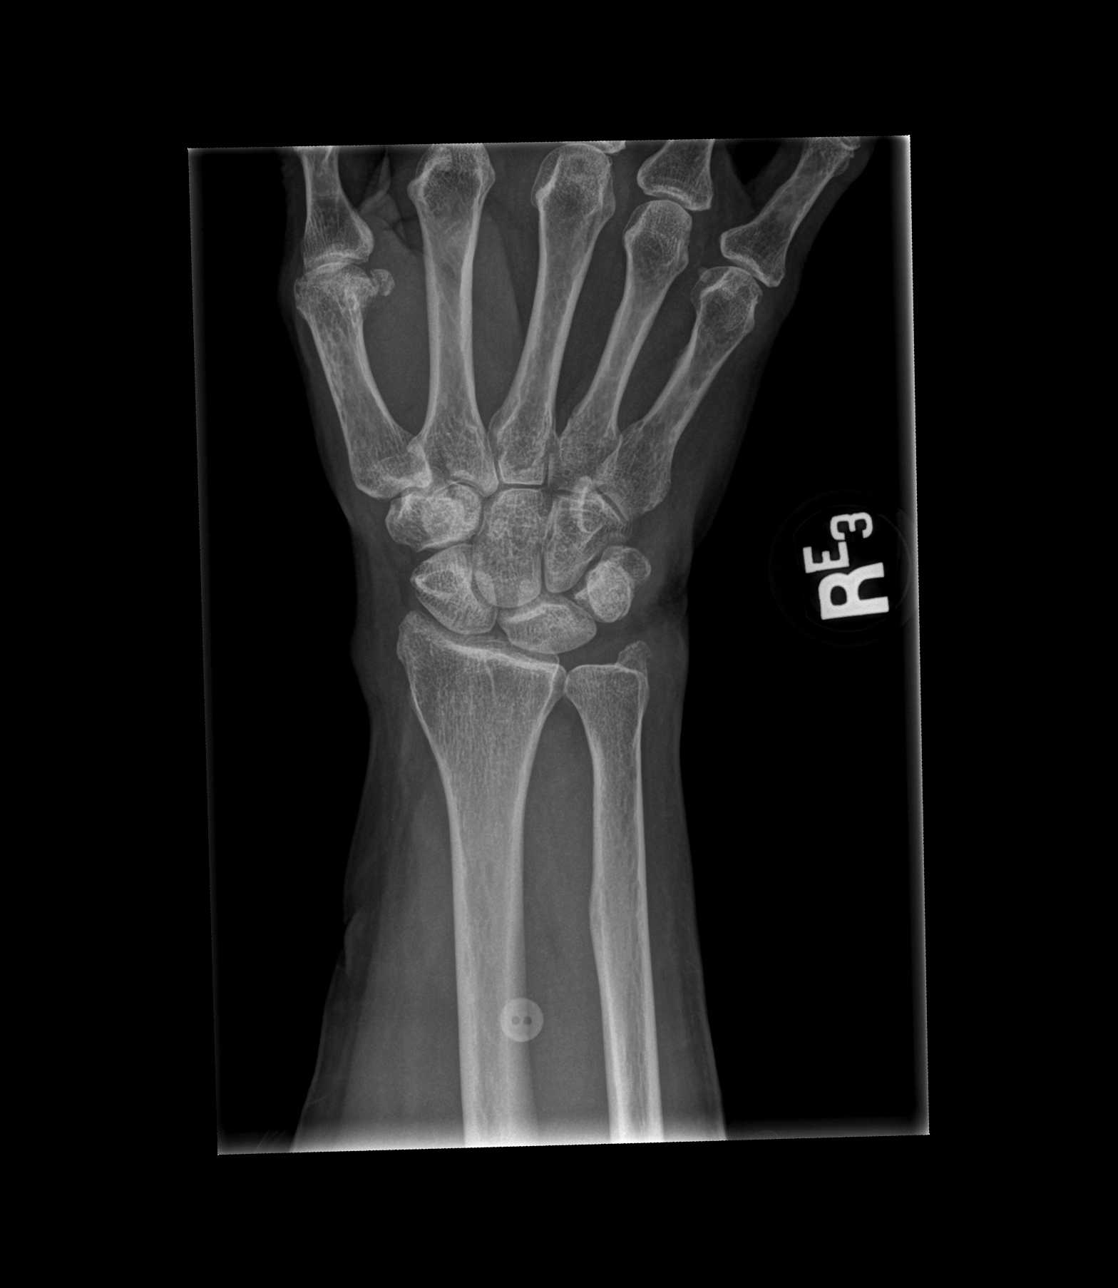

[x wrist navicular view right]
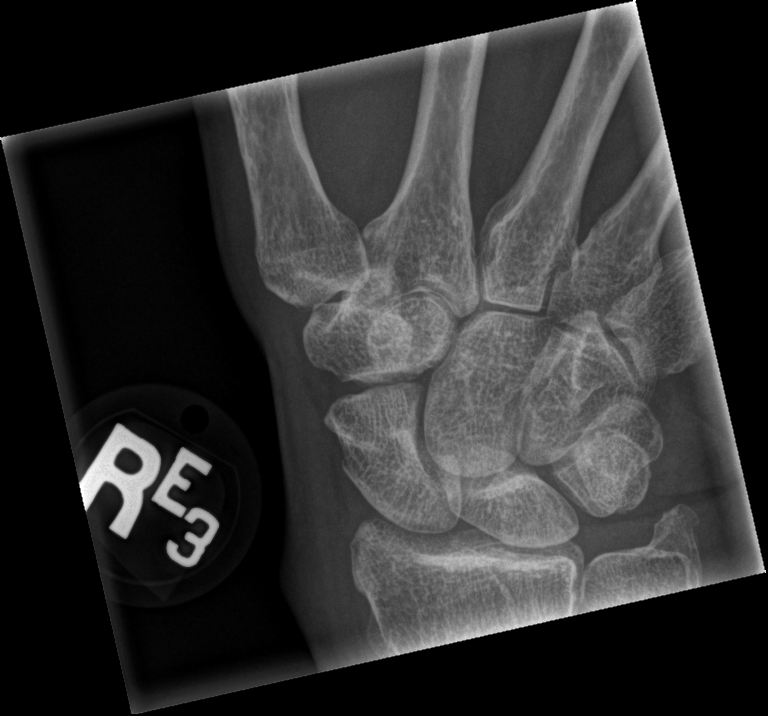

[x wrist obl right]
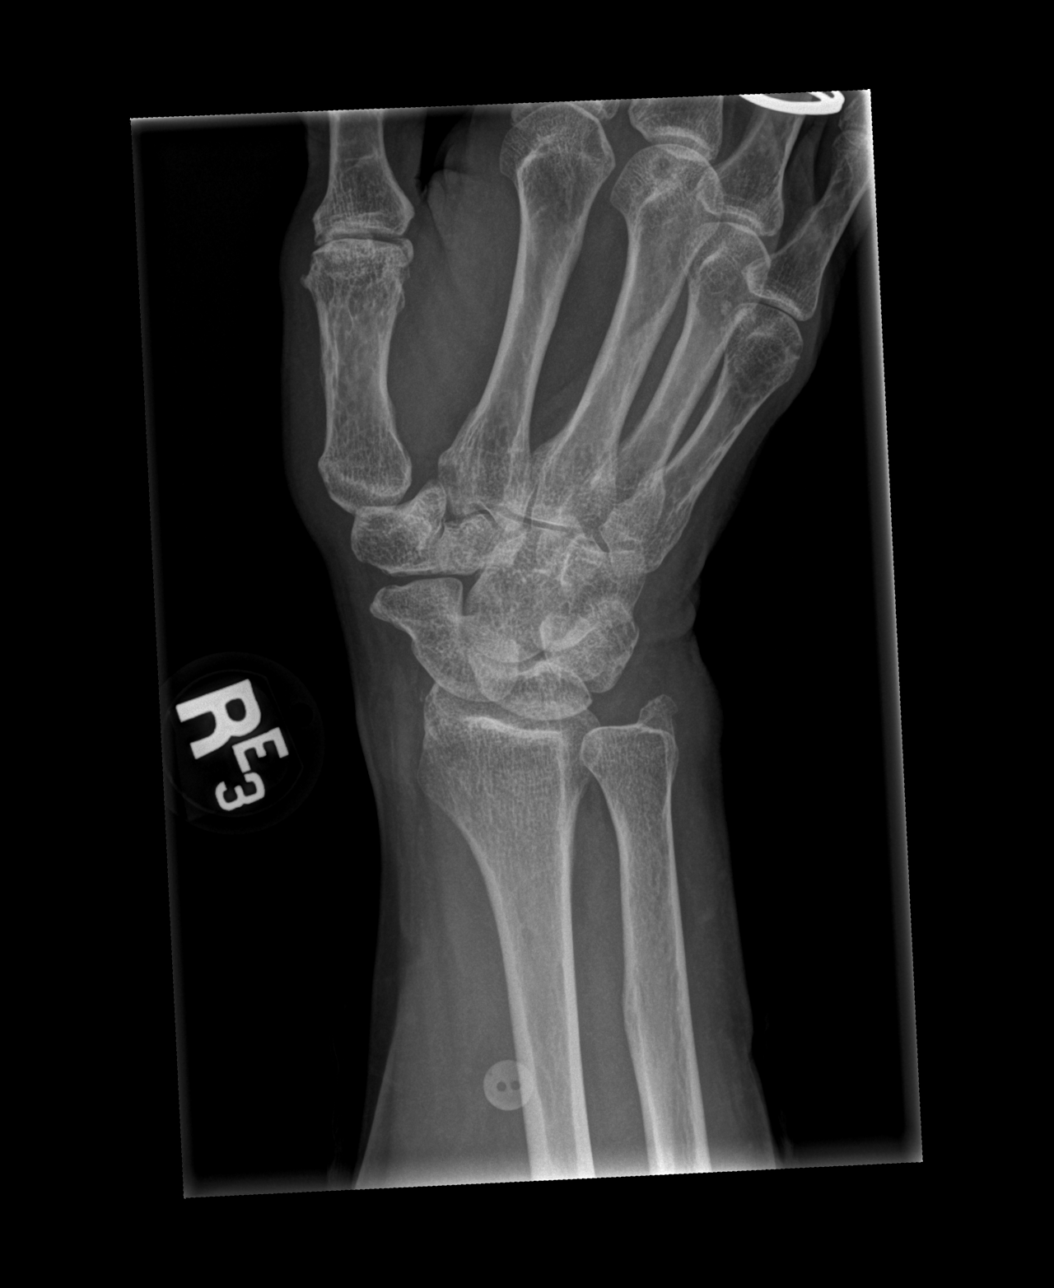

[x wrist lat right]
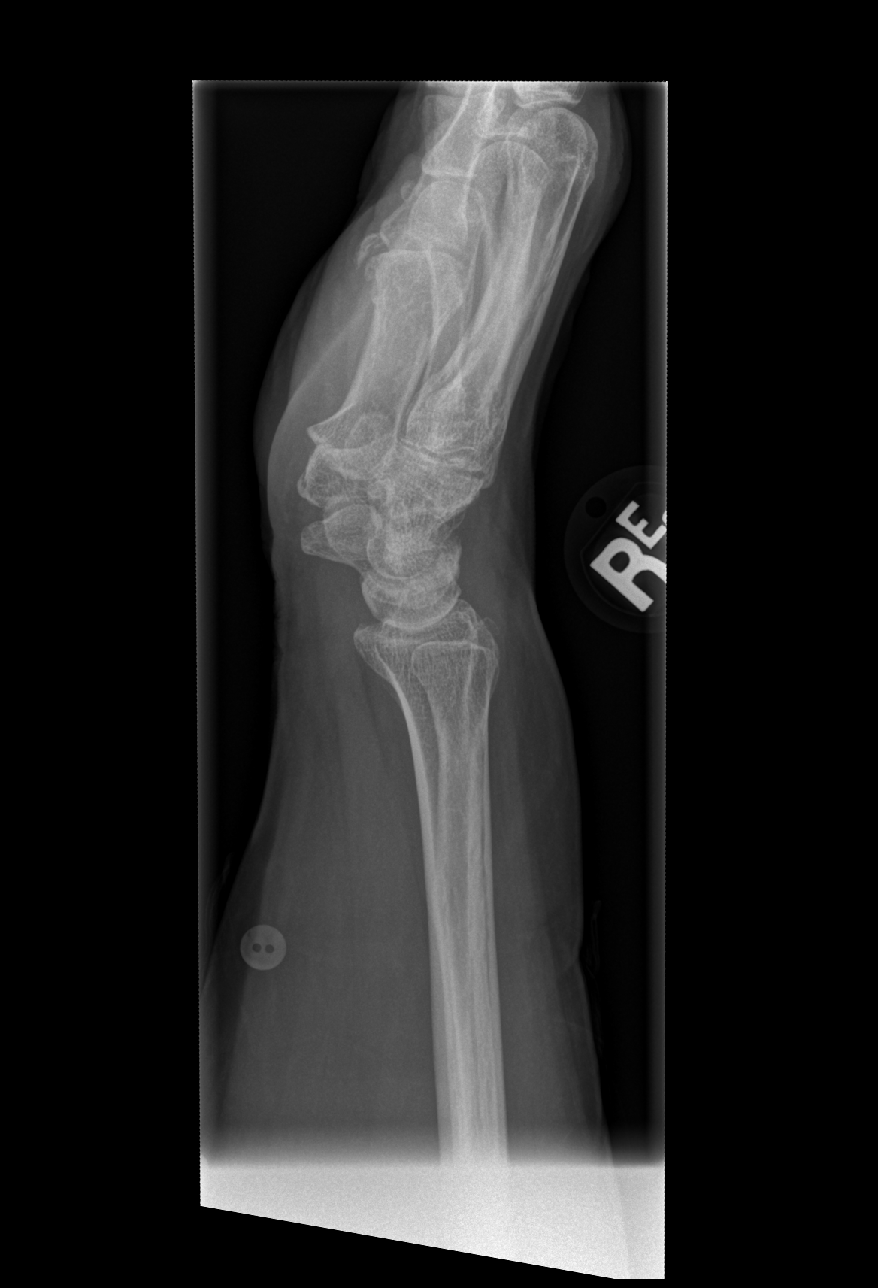

[4 of 4 positions shown; findings below may reference images not displayed]

FINDINGS: No fracture or malalignment. Mild degenerative changes at the first
CMC joint and STT interval. Soft tissue protuberance adjacent to the
distal radius.
IMPRESSION: 1. No acute osseous abnormality
2. Soft tissue swelling or small soft tissue mass adjacent to the
distal radius
